# Patient Record
Sex: Male | Born: 1945 | Hispanic: No | Smoking: Never smoker
Health system: Southern US, Community
[De-identification: ages and names within clinical notes are randomized; demographics above are authoritative.]

## PROBLEM LIST (undated history)

## (undated) DIAGNOSIS — Z87442 Personal history of urinary calculi: Secondary | ICD-10-CM

## (undated) DIAGNOSIS — S129XXA Fracture of neck, unspecified, initial encounter: Secondary | ICD-10-CM

## (undated) DIAGNOSIS — Z77098 Contact with and (suspected) exposure to other hazardous, chiefly nonmedicinal, chemicals: Secondary | ICD-10-CM

## (undated) DIAGNOSIS — T451X5A Adverse effect of antineoplastic and immunosuppressive drugs, initial encounter: Secondary | ICD-10-CM

## (undated) DIAGNOSIS — I427 Cardiomyopathy due to drug and external agent: Secondary | ICD-10-CM

## (undated) DIAGNOSIS — R7303 Prediabetes: Secondary | ICD-10-CM

## (undated) DIAGNOSIS — J189 Pneumonia, unspecified organism: Secondary | ICD-10-CM

## (undated) DIAGNOSIS — I509 Heart failure, unspecified: Secondary | ICD-10-CM

## (undated) DIAGNOSIS — Z95810 Presence of automatic (implantable) cardiac defibrillator: Secondary | ICD-10-CM

## (undated) DIAGNOSIS — Z9289 Personal history of other medical treatment: Secondary | ICD-10-CM

## (undated) DIAGNOSIS — C8590 Non-Hodgkin lymphoma, unspecified, unspecified site: Secondary | ICD-10-CM

## (undated) HISTORY — PX: TONSILLECTOMY: SUR1361

## (undated) HISTORY — DX: Heart failure, unspecified: I50.9

## (undated) HISTORY — DX: Non-Hodgkin lymphoma, unspecified, unspecified site: C85.90

---

## 2016-09-17 DIAGNOSIS — Z9289 Personal history of other medical treatment: Secondary | ICD-10-CM

## 2016-09-17 DIAGNOSIS — J189 Pneumonia, unspecified organism: Secondary | ICD-10-CM

## 2016-09-17 HISTORY — DX: Personal history of other medical treatment: Z92.89

## 2016-09-17 HISTORY — DX: Pneumonia, unspecified organism: J18.9

## 2016-09-17 HISTORY — PX: PICC LINE INSERTION: CATH118290

## 2016-09-17 HISTORY — PX: CYSTOSCOPY W/ STONE MANIPULATION: SHX1427

## 2016-11-15 DIAGNOSIS — C8590 Non-Hodgkin lymphoma, unspecified, unspecified site: Secondary | ICD-10-CM

## 2016-11-15 HISTORY — DX: Non-Hodgkin lymphoma, unspecified, unspecified site: C85.90

## 2017-07-09 ENCOUNTER — Ambulatory Visit (INDEPENDENT_AMBULATORY_CARE_PROVIDER_SITE_OTHER): Payer: Non-veteran care | Admitting: Internal Medicine

## 2017-07-09 ENCOUNTER — Ambulatory Visit (INDEPENDENT_AMBULATORY_CARE_PROVIDER_SITE_OTHER)
Admission: RE | Admit: 2017-07-09 | Discharge: 2017-07-09 | Disposition: A | Discharge status: Home / Self Care | Payer: No Typology Code available for payment source | Source: Ambulatory Visit | Attending: Internal Medicine | Admitting: Internal Medicine

## 2017-07-09 ENCOUNTER — Encounter: Payer: Self-pay | Admitting: Internal Medicine

## 2017-07-09 VITALS — BP 108/68 | HR 105 | Ht 74.0 in | Wt 192.2 lb

## 2017-07-09 DIAGNOSIS — Z8709 Personal history of other diseases of the respiratory system: Secondary | ICD-10-CM

## 2017-07-09 DIAGNOSIS — Z8781 Personal history of (healed) traumatic fracture: Secondary | ICD-10-CM

## 2017-07-09 NOTE — Progress Notes (Signed)
Subjective:     Patient ID: Cesar Campbell, male   DOB: 01-Jan-1946, 71 y.o.   MRN: [id number] PCP Quintin Alto, MD  HPI   IOV 07/09/2017  Chief Complaint  Patient presents with  . Advice Only    [facility]. Denies any cough, SOB, or CP. 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 Pt fell off the bed while asleep and cracked two ribs. Pt had a CT scan and cxr done and that was when they found the fluid.   71 year old Native American male. Ref[facility] Dr. Posey Pronto his oncologist. He is accompanied by his sister. 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 He was getting chemotherapy in [place] where he used to live. Then in April/May 2018 his wife died and he also had an ICU admission for sepsis with pancytopenia. After this his sister moved into the [facility], [place]. Most recently his finished his final CHOP chemotherapy regimen His main problem is fatigue. According to the sister and him in August 71 year old out of bed and accidentally fell and fractured his right ribs. A CT scan done sometime in September 2018 at the [facility] shows rib fracture with pleural effusion and therefore he is been referred. Because of the long wait time at the [facility] he has not seen a pulmonologist and it is taken this long to get a CT scan Therefore he is off to go through the choice program and seek pulmonary consultation with [place]. Currently he says he is using incentive spirometry. He denies any shortness of breath or cough or wheezing or edema or paroxysmal nocturnal dyspnea or orthopnea. His weight loss. This no underlying pulmonary issues of COPD or pulmonary fibrosis    has no past medical history on file.   reports that he has never smoked. He has never used smokeless tobacco.  No past surgical history on file.  Not on File   There is no immunization history on file for  this patient.  No family history on file.   Current Outpatient Prescriptions:  .  finasteride (PROSCAR) 5 MG tablet, Take 5 mg by mouth daily., Disp: , Rfl:  .  tamsulosin (FLOMAX) 0.4 MG CAPS capsule, Take 0.4 mg by mouth daily after supper., Disp: , Rfl:     Review of Systems     Objective:   Physical Exam  Constitutional: He is oriented to person, place, and time. He appears well-developed and well-nourished. No distress.  Mild alopecia  HENT:  Head: Normocephalic and atraumatic.  Right Ear: External ear normal.  Left Ear: External ear normal.  Mouth/Throat: Oropharynx is clear and moist. No oropharyngeal exudate.  Eyes: Pupils are equal, round, and reactive to light. Conjunctivae and EOM are normal. Right eye exhibits no discharge. Left eye exhibits no discharge. No scleral icterus.  Neck: Normal range of motion. Neck supple. No JVD present. No tracheal deviation present. No thyromegaly present.  Cardiovascular: Normal rate, regular rhythm and intact distal pulses.  Exam reveals no gallop and no friction rub.   No murmur heard. Pulmonary/Chest: Effort normal and breath sounds normal. No respiratory distress. He has no wheezes. He has no rales. He exhibits no tenderness.  Abdominal: Soft. Bowel sounds are normal. He exhibits no distension and no mass. There is no tenderness. There is no rebound and no guarding.  Musculoskeletal: Normal range of motion.  He exhibits no edema or tenderness.  Looks a bit deconditioned  Lymphadenopathy:    He has no cervical adenopathy.  Neurological: He is alert and oriented to person, place, and time. He has normal reflexes. No cranial nerve deficit. Coordination normal.  Skin: Skin is warm and dry. No rash noted. He is not diaphoretic. No erythema. No pallor.  Psychiatric: He has a normal mood and affect. His behavior is normal. Judgment and thought content normal.  Nursing note and vitals reviewed.  Vitals:   07/09/17 1457  BP: 108/68   Pulse: (!) 105  SpO2: 97%  Weight: 192 lb 3.2 oz (87.2 kg)  Height: 6\' 2"  (1.88 m)    Estimated body mass index is 24.68 kg/m as calculated from the following:   Height as of this encounter: 6\' 2"  (1.88 m).   Weight as of this encounter: 192 lb 3.2 oz (87.2 kg).       Assessment:       ICD-10-CM   1. History of rib fracture Z87.81 DG Chest 2 View  2. History of pleural effusion Z87.09 DG Chest 2 View       Plan:       Please do cxr 2 view to decide the next step in your care Will call with results   Dr. Brand Males, M.D., [facility].C.P Pulmonary and Critical Care Medicine Staff Physician [place] [facility] Pager: [phone], If no answer or between  15:00h - 7:00h: call [phone]  07/09/2017 3:35 PM

## 2017-07-09 NOTE — Progress Notes (Signed)
   Subjective:    Patient ID: Cesar Campbell, male    DOB: November 15, 1945, 71 y.o.   MRN: [id number]  HPI    Review of Systems  Constitutional: Negative for fever and unexpected weight change.  HENT: Negative for congestion, dental problem, ear pain, nosebleeds, postnasal drip, rhinorrhea, sinus pressure, sneezing, sore throat and trouble swallowing.   Eyes: Negative for redness and itching.  Respiratory: Negative for cough, chest tightness, shortness of breath and wheezing.   Cardiovascular: Positive for leg swelling. Negative for palpitations.  Gastrointestinal: Negative for nausea and vomiting.  Genitourinary: Negative for dysuria.  Musculoskeletal: Negative for joint swelling.  Skin: Negative for rash.  Allergic/Immunologic: Positive for environmental allergies. Negative for food allergies and immunocompromised state.  Neurological: Negative for headaches.  Hematological: Does not bruise/bleed easily.  Psychiatric/Behavioral: Negative for dysphoric mood. The patient is not nervous/anxious.        Objective:   Physical Exam        Assessment & Plan:

## 2017-07-09 NOTE — Patient Instructions (Signed)
ICD-10-CM   1. History of rib fracture Z87.81   2. History of pleural effusion Z87.09     Please do cxr 2 view to decide the next step in your care Will call with results

## 2017-07-15 ENCOUNTER — Telehealth: Payer: Self-pay | Admitting: Internal Medicine

## 2017-07-15 DIAGNOSIS — R0602 Shortness of breath: Secondary | ICD-10-CM

## 2017-07-15 NOTE — Telephone Encounter (Signed)
Let [name] know that CXR shows   A) no report of rib fracture B) bilateral pleural effusions - more on the left (other side of rib fracture hx) overall small to moderate C) some enarlgement in central chst and this could be his lymphoma  In order to assess anything further he needs  A) CT chest with contrast - mediastinal adenopathy, hx rib fracture, bilateral effusion, lymphoma, dyspnea B) ECHO   ANd return to see [place]  Dr. Brand Males, M.D., [facility].C.P Pulmonary and Critical Care Medicine Staff Physician [place] [facility] Pager: [phone], If no answer or between  15:00h - 7:00h: call [phone]  07/15/2017 9:41 AM

## 2017-07-19 NOTE — Telephone Encounter (Signed)
Called pt to relay the results of his CXR with him and spoke with his sister, Cesar Campbell. Cesar Campbell the results from the CXR and that MR wanted pt to have a CT with contrast done as well as an ECHO.  Pt had a CT w/ contrast done at the [facility] 07/16/17. Cesar Campbell stated to me that she will have the [place] fax [place] the report of the CT scan.  Will place order for the Echo to be done.  Nothing further needed at this time.

## 2017-07-22 ENCOUNTER — Telehealth: Payer: Self-pay | Admitting: Internal Medicine

## 2017-07-22 ENCOUNTER — Encounter: Payer: Self-pay | Admitting: Internal Medicine

## 2017-07-22 ENCOUNTER — Ambulatory Visit (INDEPENDENT_AMBULATORY_CARE_PROVIDER_SITE_OTHER): Payer: Non-veteran care | Admitting: Internal Medicine

## 2017-07-22 ENCOUNTER — Ambulatory Visit (INDEPENDENT_AMBULATORY_CARE_PROVIDER_SITE_OTHER)
Admission: RE | Admit: 2017-07-22 | Discharge: 2017-07-22 | Disposition: A | Discharge status: Home / Self Care | Payer: Non-veteran care | Source: Ambulatory Visit | Attending: Internal Medicine | Admitting: Internal Medicine

## 2017-07-22 VITALS — BP 120/82 | HR 100 | Ht 74.0 in | Wt 196.8 lb

## 2017-07-22 DIAGNOSIS — Z8709 Personal history of other diseases of the respiratory system: Secondary | ICD-10-CM

## 2017-07-22 DIAGNOSIS — R0689 Other abnormalities of breathing: Secondary | ICD-10-CM

## 2017-07-22 DIAGNOSIS — R0600 Dyspnea, unspecified: Secondary | ICD-10-CM | POA: Diagnosis not present

## 2017-07-22 NOTE — Telephone Encounter (Signed)
Spoke with Marlowe Kays. She stated that the [place] will not release the CT results without a signed medical release. She stated that she will bring the patient by the office today to sign one so that we can fax it to [place].   Nothing else needed at time of call.

## 2017-07-22 NOTE — Telephone Encounter (Signed)
Spoke with Marlowe Kays. She stated she went by to check on the patient this morning. He stated that he has been SOB for the past night and is panting to catch his breath.   Patient has been scheduled to see MR this afternoon at 130. She verbalized understanding. Nothing else needed at time of call.

## 2017-07-22 NOTE — Progress Notes (Signed)
Subjective:     Patient ID: Cesar Campbell, male   DOB: 1946/07/10, 71 y.o.   MRN: [id number]  HPI IOV 07/09/2017  Chief Complaint  Patient presents with  . Advice Only    [facility] due to [facility]usion. Denies any cough, SOB, or CP. 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 Pt fell off the bed while asleep and cracked two ribs. Pt had a CT scan and cxr done and that was when they found the fluid.   71 year old Native American male. Referred by the [facility] Dr. Posey Pronto his oncologist. He is accompanied by his sister. 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 He was getting chemotherapy in [place] where he used to live. Then in April/May 2018 his wife died and he also had an ICU admission for sepsis with pancytopenia. After this his sister moved into the [facility], [place]. Most recently his finished his final CHOP chemotherapy regimen His main problem is fatigue. According to the sister and him in August 71 year old out of bed and accidentally fell and fractured his right ribs. A CT scan done sometime in September 2018 at the [facility] shows rib fracture with pleural effusion and therefore he is been referred. Because of the long wait time at the [facility] he has not seen a pulmonologist and it is taken this long to get a CT scan Therefore he is off to go through the choice program and seek pulmonary consultation with [place]. Currently he says he is using incentive spirometry. He denies any shortness of breath or cough or wheezing or edema or paroxysmal nocturnal dyspnea or orthopnea. His weight loss. This no underlying pulmonary issues of COPD or pulmonary fibrosis    OV 07/22/2017  Chief Complaint  Patient presents with  . Acute Visit    Pt  became very SOB overnight 07/21/17. States that he was hyperventilating and is very SOB, coughing up clear phlegm, and chest discomfort.   71 year-old male  returns acutely. I saw him just a few weeks ago with shortness of breath in the setting of lymphoma based chemotherapy. Chest x-ray showed bilateral effusions. We were in the process of getting a CT scan chest and echocardiogram. He tells me that he did have a CT scan of the chest but at the [facility] in [place]. He does not know the results. I do not have those results with me. He says since then he's had progressive shortness of breath and last night was significantly worse when he is home and try to do some activities of daily living such as changing the sheets. Therefore he is here acutely today. He is yet to have his echo. This no other symptoms such as cough or wheezing or orthopnea     has no past medical history on file.   reports that  has never smoked. he has never used smokeless tobacco.  No past surgical history on file.  Allergies  Allergen Reactions  . Penicillins Hives    Immunization History  Administered Date(s) Administered  . Influenza, High Dose Seasonal PF 07/15/2017    No family history on file.   Current Outpatient Medications:  .  albuterol (PROVENTIL HFA;VENTOLIN HFA) 108 (90 Base) MCG/ACT inhaler, Inhale 2 puffs every 6 (six) hours as needed into the lungs for wheezing or shortness of breath., Disp: , Rfl:  .  finasteride (PROSCAR) 5 MG  tablet, Take 5 mg by mouth daily., Disp: , Rfl:  .  tamsulosin (FLOMAX) 0.4 MG CAPS capsule, Take 0.4 mg by mouth daily after supper., Disp: , Rfl:     Review of Systems     Objective:   Physical Exam  Constitutional: He is oriented to person, place, and time. He appears well-developed and well-nourished. No distress.  Obese, sitting in wheel chair, talkative today, nasal cannula on  HENT:  Head: Normocephalic and atraumatic.  Right Ear: External ear normal.  Left Ear: External ear normal.  Mouth/Throat: Oropharynx is clear and moist. No oropharyngeal exudate.  Eyes: Conjunctivae and EOM are normal.  Pupils are equal, round, and reactive to light. Right eye exhibits no discharge. Left eye exhibits no discharge. No scleral icterus.  Neck: Normal range of motion. Neck supple. No JVD present. No tracheal deviation present. No thyromegaly present.  Cardiovascular: Normal rate, regular rhythm and intact distal pulses. Exam reveals no gallop and no friction rub.  No murmur heard. Pulmonary/Chest: Effort normal and breath sounds normal. No respiratory distress. He has no wheezes. He has no rales. He exhibits no tenderness.  Abdominal: Soft. Bowel sounds are normal. He exhibits no distension and no mass. There is no tenderness. There is no rebound and no guarding.  Musculoskeletal: Normal range of motion. He exhibits no edema or tenderness.  Lymphadenopathy:    He has no cervical adenopathy.  Neurological: He is alert and oriented to person, place, and time. He has normal reflexes. No cranial nerve deficit. Coordination normal.  Skin: Skin is warm and dry. No rash noted. He is not diaphoretic. No erythema. No pallor.  Psychiatric: He has a normal mood and affect. His behavior is normal. Judgment and thought content normal.  Nursing note and vitals reviewed.  Vitals:   07/22/17 1339  BP: 120/82  Pulse: 100  SpO2: 99%  Weight: 196 lb 12.8 oz (89.3 kg)  Height: 6\' 2"  (1.88 m)    Estimated body mass index is 25.27 kg/m as calculated from the following:   Height as of this encounter: 6\' 2"  (1.88 m).   Weight as of this encounter: 196 lb 12.8 oz (89.3 kg).      Assessment:       ICD-10-CM   1. History of pleural effusion Z87.09 DG Chest 2 View  2. Dyspnea and respiratory abnormalities R06.00 DG Chest 2 View   R06.89        Plan:      repeat cxr 2 view; depending on results timing of thoracentesis Sign release to get CT report from [place] done in the last few weeks Get echo ASAP   Followup Will call with cxr results 07/22/2017 or 07/23/17 Any worsening problems go to  ER Return in few weeks but after echo   Dr. Brand Males, M.D., [facility].C.P Pulmonary and Critical Care Medicine Staff Physician [place] [facility] Pager: [phone], If no answer or between  15:00h - 7:00h: call [phone]  07/22/2017 2:01 PM

## 2017-07-22 NOTE — Patient Instructions (Signed)
ICD-10-CM   1. History of pleural effusion Z87.09   2. Dyspnea and respiratory abnormalities R06.00    R06.89     repeat cxr 2 view; depending on results timing of thoracentesis Sign release to get CT report from [place] done in the last few weeks Get echo ASAP   Followup Will call with cxr results 07/22/2017 or 07/23/17 Any worsening problems go to ER Return in few weeks but after echo

## 2017-07-23 ENCOUNTER — Ambulatory Visit (HOSPITAL_COMMUNITY): Payer: Non-veteran care | Attending: Cardiovascular Disease

## 2017-07-23 ENCOUNTER — Other Ambulatory Visit: Payer: Self-pay

## 2017-07-23 DIAGNOSIS — J90 Pleural effusion, not elsewhere classified: Secondary | ICD-10-CM | POA: Diagnosis not present

## 2017-07-23 DIAGNOSIS — Z8572 Personal history of non-Hodgkin lymphomas: Secondary | ICD-10-CM | POA: Insufficient documentation

## 2017-07-23 DIAGNOSIS — I081 Rheumatic disorders of both mitral and tricuspid valves: Secondary | ICD-10-CM | POA: Diagnosis not present

## 2017-07-23 DIAGNOSIS — R609 Edema, unspecified: Secondary | ICD-10-CM | POA: Diagnosis not present

## 2017-07-23 DIAGNOSIS — Z9221 Personal history of antineoplastic chemotherapy: Secondary | ICD-10-CM | POA: Insufficient documentation

## 2017-07-23 DIAGNOSIS — R0602 Shortness of breath: Secondary | ICD-10-CM | POA: Diagnosis not present

## 2017-07-24 ENCOUNTER — Telehealth: Payer: Self-pay | Admitting: Internal Medicine

## 2017-07-24 DIAGNOSIS — I5020 Unspecified systolic (congestive) heart failure: Secondary | ICD-10-CM

## 2017-07-24 MED ORDER — FUROSEMIDE 40 MG PO TABS: 40.0000 mg | ORAL_TABLET | Freq: Every day | ORAL | 5 refills | Status: DC

## 2017-07-24 NOTE — Telephone Encounter (Signed)
Called to refer this patient to Dr Einar Gip and due to the insurance the referral has to come from the [place] or it will not be covered I have called the patient and spoke to the wife they are seeing the [place] tomorrow. She stated they will speak to them tomorrow about this.

## 2017-07-24 NOTE — Telephone Encounter (Signed)
Sending to triage given nature of echo result. [facility]sts done at [facility]  CXR - unchanged effusion   ECHO - EF 25% and he has systolic chf   Plan - start lasix 40mg  daily + kcl 11meq daily  - any issues with breathing go to ER - refer cardiology [facility] or Dr Einar Gip or [facility] - anyone who can see him this week  - w ill reviwe outside records when I return   Dr. Brand Males, M.D., [facility].C.P Pulmonary and Critical Care Medicine Staff Physician [place] [facility] Pager: [phone], If no answer or between  15:00h - 7:00h: call [phone]  07/24/2017 12:15 PM     Dg Chest 2 View  Result Date: 07/23/2017 CLINICAL DATA:  Left pleural effusion EXAM: CHEST  2 VIEW COMPARISON:  07/09/2017 FINDINGS: The heart remains enlarged. Lungs remain under aerated. Bilateral pleural effusions are stable. Bibasilar opacity compatible with atelectasis versus airspace disease is stable. No pneumothorax. IMPRESSION: Stable. Bilateral pleural effusions and bibasilar pulmonary opacity as described. Electronically Signed   By: Marybelle Killings M.D.   On: 07/23/2017 08:00

## 2017-07-24 NOTE — Telephone Encounter (Signed)
Spoke with pt's wife and advised results and plan to see Dr. Einar Gip and to start Lasix. Nothing further is needed.

## 2017-08-06 ENCOUNTER — Ambulatory Visit: Payer: No Typology Code available for payment source | Admitting: Internal Medicine

## 2017-08-19 ENCOUNTER — Ambulatory Visit: Payer: No Typology Code available for payment source | Admitting: Internal Medicine

## 2017-10-14 ENCOUNTER — Telehealth: Payer: Self-pay | Admitting: Internal Medicine

## 2017-10-14 NOTE — Telephone Encounter (Signed)
Faxed pt's OV from 07/22/17 to Tim with [facility]  Called Tim letting him know this was done.   Tim expressed understanding. Nothing further needed at this current time.

## 2017-12-25 ENCOUNTER — Encounter: Payer: Self-pay | Admitting: Cardiovascular Disease

## 2017-12-25 ENCOUNTER — Ambulatory Visit (INDEPENDENT_AMBULATORY_CARE_PROVIDER_SITE_OTHER): Payer: No Typology Code available for payment source | Admitting: Cardiovascular Disease

## 2017-12-25 DIAGNOSIS — I428 Other cardiomyopathies: Secondary | ICD-10-CM | POA: Diagnosis not present

## 2017-12-25 NOTE — Patient Instructions (Signed)
Medication Instructions:  Your physician recommends that you continue on your current medications as directed. Please refer to the Current Medication list given to you today.   Labwork: none  Testing/Procedures: none  Follow-Up: Your physician recommends that you schedule a follow-up appointment in: in the next WEEK with Dr. Sallyanne Kuster - ICD implacment  You have been referred to CHF Clinic    Any Other Special Instructions Will Be Listed Below (If Applicable).     If you need a refill on your cardiac medications before your next appointment, please call your pharmacy.

## 2017-12-25 NOTE — Progress Notes (Signed)
12/25/2017 [name]   Aug 29, 1946  [id number]  Primary Physician Posey Pronto, Ninfa Meeker, MD Primary Cardiologist: Lorretta Harp MD Lupe Carney, [place]  HPI:  Cesar Campbell is a 72 y.o. thin-appearing widowed Caucasian male father of one child, grandfather and 2 grandchildren who is a retired Probation officer and was referred by the [employer], resulting from ICD implantation. He has no cardiac risk factors. He does have non-Hodgkin's lymphoma currently in remission after 6 rounds of CHOP chemotherapy the left one of which was 9/18. His EF is 20% range. He is on optimal medications for this and has early class II heart failure symptoms. He's been wearing a LifeVest for last 5 months.    Current Meds  Medication Sig  . albuterol (PROVENTIL HFA;VENTOLIN HFA) 108 (90 Base) MCG/ACT inhaler Inhale 2 puffs every 6 (six) hours as needed into the lungs for wheezing or shortness of breath.  . finasteride (PROSCAR) 5 MG tablet Take 5 mg by mouth daily.  . furosemide (LASIX) 40 MG tablet Take 1 tablet (40 mg total) daily by mouth.  . tamsulosin (FLOMAX) 0.4 MG CAPS capsule Take 0.4 mg by mouth daily after supper.     Allergies  Allergen Reactions  . Penicillins Hives    Social History   Socioeconomic History  . Marital status: Widowed    Spouse name: Not on file  . Number of children: Not on file  . Years of education: Not on file  . Highest education level: Not on file  Occupational History  . Not on file  Social Needs  . Financial resource strain: Not on file  . Food insecurity:    Worry: Not on file    Inability: Not on file  . Transportation needs:    Medical: Not on file    Non-medical: Not on file  Tobacco Use  . Smoking status: Never Smoker  . Smokeless tobacco: Never Used  Substance and Sexual Activity  . Alcohol use: Not on file  . Drug use: Not on file  . Sexual activity: Not on file  Lifestyle  . Physical activity:    Days per week: Not on file    Minutes per  session: Not on file  . Stress: Not on file  Relationships  . Social connections:    Talks on phone: Not on file    Gets together: Not on file    Attends religious service: Not on file    Active member of club or organization: Not on file    Attends meetings of clubs or organizations: Not on file    Relationship status: Not on file  . Intimate partner violence:    Fear of current or ex partner: Not on file    Emotionally abused: Not on file    Physically abused: Not on file    Forced sexual activity: Not on file  Other Topics Concern  . Not on file  Social History Narrative  . Not on file     Review of Systems: General: negative for chills, fever, night sweats or weight changes.  Cardiovascular: negative for chest pain, dyspnea on exertion, edema, orthopnea, palpitations, paroxysmal nocturnal dyspnea or shortness of breath Dermatological: negative for rash Respiratory: negative for cough or wheezing Urologic: negative for hematuria Abdominal: negative for nausea, vomiting, diarrhea, bright red blood per rectum, melena, or hematemesis Neurologic: negative for visual changes, syncope, or dizziness All other systems reviewed and are otherwise negative except as noted above.  Blood pressure 112/74, pulse 70, height 6\' 2"  (1.88 m), weight 192 lb (87.1 kg).  General appearance: alert and no distress Neck: no adenopathy, no carotid bruit, no JVD, supple, symmetrical, trachea midline and thyroid not enlarged, symmetric, no tenderness/mass/nodules Lungs: clear to auscultation bilaterally Heart: regular rate and rhythm, S1, S2 normal, no murmur, click, rub or gallop Extremities: extremities normal, atraumatic, no cyanosis or edema Pulses: 2+ and symmetric Skin: Skin color, texture, turgor normal. No rashes or lesions Neurologic: Alert and oriented X 3, normal strength and tone. Normal symmetric reflexes. Normal coordination and gait  EKG sinus rhythm at 70 without ST or T-wave  changes. I personally reviewed this EKG.  ASSESSMENT AND PLAN:   Nonischemic cardiomyopathy [facility]) Cesar Campbell unfortunately had non-Hodgkin's lymphoma and 6 rounds of CHOP chemotherapy. His EF is about 20% range. He has been wearing a life vest for the last 5 months. He is in the [place] [facility] in [place]. He does have mild mild heart failure symptoms on appropriate optimal medical therapy. He was referred here for ICD implantation. I am going to refer him to Dr. Sallyanne Kuster to discuss and perform this procedure.      Lorretta Harp MD FACP,FACC,FAHA, [facility] 12/25/2017 3:27 PM

## 2017-12-25 NOTE — Assessment & Plan Note (Signed)
Cesar Campbell unfortunately had non-Hodgkin's lymphoma and 6 rounds of CHOP chemotherapy. His EF is about 20% range. He has been wearing a life vest for the last 5 months. He is in the [place] medical system in [place]. He does have mild mild heart failure symptoms on appropriate optimal medical therapy. He was referred here for ICD implantation. I am going to refer him to Dr. Sallyanne Kuster to discuss and perform this procedure.

## 2017-12-26 ENCOUNTER — Telehealth: Payer: Self-pay | Admitting: Cardiovascular Disease

## 2017-12-26 ENCOUNTER — Telehealth (HOSPITAL_COMMUNITY): Payer: Self-pay | Admitting: Internal Medicine

## 2017-12-26 NOTE — Telephone Encounter (Signed)
Discussed with Chelley and ok to work patient in on 4/17 at 8:40. Advised, verbalized understanding

## 2017-12-26 NOTE — Telephone Encounter (Signed)
Called patient to give him New CHF appt with Dr. Haroldine Laws.  No answer or machine.  Will try to call patient back.

## 2017-12-26 NOTE — Telephone Encounter (Signed)
New message     Patient was scheduled for a MRI on  01/02/18 through the [place] , they want to make sure the cancer is still in remission, please call to follow up and schedule surgery for pace maker

## 2018-01-01 ENCOUNTER — Ambulatory Visit (INDEPENDENT_AMBULATORY_CARE_PROVIDER_SITE_OTHER): Payer: No Typology Code available for payment source | Admitting: Cardiovascular Disease

## 2018-01-01 ENCOUNTER — Encounter: Payer: Self-pay | Admitting: Cardiovascular Disease

## 2018-01-01 VITALS — BP 102/73 | HR 88 | Ht 75.0 in | Wt 197.0 lb

## 2018-01-01 DIAGNOSIS — Z9189 Other specified personal risk factors, not elsewhere classified: Secondary | ICD-10-CM

## 2018-01-01 DIAGNOSIS — I5022 Chronic systolic (congestive) heart failure: Secondary | ICD-10-CM

## 2018-01-01 DIAGNOSIS — I428 Other cardiomyopathies: Secondary | ICD-10-CM | POA: Diagnosis not present

## 2018-01-01 DIAGNOSIS — Z01812 Encounter for preprocedural laboratory examination: Secondary | ICD-10-CM

## 2018-01-01 NOTE — Progress Notes (Signed)
Cardiology Office Note:    Date:  01/01/2018   ID:  [name], DOB 01/20/1946, MRN [id number]  PCP:  Quintin Alto, MD  Cardiologist:  Quay Burow, MD   Referring MD: Quintin Alto, MD   Chief Complaint  Patient presents with  . AICD Problem    Referred to discuss AICD    History of Present Illness:    Cesar Campbell is a 72 y.o. 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  He is routinely followed at the [facility] in [place].  His ejection fraction has been estimated at 20% and has not improved despite treatment with maximum tolerated doses of ACE inhibitor and beta-blocker.  His typical systolic blood pressure is around 100, he occasionally has dizziness, there does not appear to be room for additional heart failure therapy.  Despite this he has good functional status, NYHA class II and does not require loop diuretics.  His echocardiogram also confirmed evidence of diastolic dysfunction with elevated filling pressures, but he denies shortness of breath at rest or with usual activity.  There are no significant valvular abnormalities on his echo, the pulmonary pressure was mildly elevated.  He had a PICC line in his left antecubital vein for chemotherapy, which has been since removed.  He has never had angina pectoris.  His EKG does not show any ischemic changes and in fact is remarkably normal, with narrow QRS and normal QT interval.  He has been wearing a LifeVest for the last 5 months, no interventions to date.  Past Medical History:  Diagnosis Date  . CHF (congestive heart failure) ([place])   . NHL (non-Hodgkin's lymphoma) [facility])     Past Surgical History:  Procedure Laterality Date  . PICC LINE INSERTION Left     Current Medications: Current Meds  Medication Sig  . albuterol (PROVENTIL HFA;VENTOLIN HFA) 108 (90 Base) MCG/ACT inhaler Inhale 2 puffs every 6 (six) hours as needed into the lungs  for wheezing or shortness of breath.  . carvedilol (COREG) 12.5 MG tablet Take 6.25 mg by mouth 2 (two) times daily with a meal.  . finasteride (PROSCAR) 5 MG tablet Take 5 mg by mouth daily.  . furosemide (LASIX) 40 MG tablet Take 1 tablet (40 mg total) daily by mouth.  Marland Kitchen lisinopril (PRINIVIL,ZESTRIL) 5 MG tablet Take 5 mg by mouth daily.  . tamsulosin (FLOMAX) 0.4 MG CAPS capsule Take 0.4 mg by mouth daily after supper.     Allergies:   Penicillins   Social History   Socioeconomic History  . Marital status: Widowed    Spouse name: Not on file  . Number of children: Not on file  . Years of education: Not on file  . Highest education level: Not on file  Occupational History  . Not on file  Social Needs  . Financial resource strain: Not on file  . Food insecurity:    Worry: Not on file    Inability: Not on file  . Transportation needs:    Medical: Not on file    Non-medical: Not on file  Tobacco Use  . Smoking status: Never Smoker  . Smokeless tobacco: Never Used  Substance and Sexual Activity  . Alcohol use: Not on file  . Drug use: Not on file  . Sexual activity: Not on file  Lifestyle  . Physical activity:    Days per week: Not on file    Minutes per session: Not on  file  . Stress: Not on file  Relationships  . Social connections:    Talks on phone: Not on file    Gets together: Not on file    Attends religious service: Not on file    Active member of club or organization: Not on file    Attends meetings of clubs or organizations: Not on file    Relationship status: Not on file  Other Topics Concern  . Not on file  Social History Narrative  . Not on file     Family History: The patient's family history includes Heart attack in his father.  ROS:   Please see the history of present illness.     All other systems reviewed and are negative.  EKGs/Labs/Other Studies Reviewed:    The following studies were reviewed today: Notes from Dr. Gwenlyn Found  EKG:  EKG  is not ordered today.  The ekg ordered 12/25/2017 demonstrates NSR, normal ECG, QRS <100 ms, normal QTc  Recent Labs: No results found for requested labs within last 8760 hours.  Recent Lipid Panel No results found for: CHOL, TRIG, HDL, CHOLHDL, VLDL, LDLCALC, LDLDIRECT  Physical Exam:    VS:  BP 102/73   Pulse 88   Ht 6\' 3"  (1.905 m)   Wt 197 lb (89.4 kg)   BMI 24.62 kg/m     Wt Readings from Last 3 Encounters:  01/01/18 197 lb (89.4 kg)  12/25/17 192 lb (87.1 kg)  07/22/17 196 lb 12.8 oz (89.3 kg)     GEN: Lean, Well nourished, well developed in no acute distress HEENT: Normal NECK: No JVD; No carotid bruits LYMPHATICS: No lymphadenopathy CARDIAC: RRR, no murmurs, rubs, gallops. No collateral venous circulation L shoulder RESPIRATORY:  Clear to auscultation without rales, wheezing or rhonchi  ABDOMEN: Soft, non-tender, non-distended MUSCULOSKELETAL:  No edema; No deformity  SKIN: Warm and dry NEUROLOGIC:  Alert and oriented x 3 PSYCHIATRIC:  Normal affect   ASSESSMENT:    1. CHF (congestive heart failure), NYHA class II, chronic, systolic ([place])   2. Nonischemic cardiomyopathy ([place])   3. At risk for sudden cardiac death   4. Pre-procedure lab exam    PLAN:    In order of problems listed above:  1. CHF:  Clinically euvolemic, NYHA class 2. On maximum tolerated doses of carvedilol and ACEi. 2. CMP: chemo related. No symptoms or signs of CAD. 3. AICD: meets criteria for primary prevention ICD implantation for non ischemic cardiomyopathy (left ventricular ejection fraction under 35%, heart failure NYHA class II-III, on comprehensive medical therapy). Discussed pros and cons of AICD in detail. This procedure has been fully reviewed with the patient and written informed consent has been obtained.    Medication Adjustments/Labs and Tests Ordered: Current medicines are reviewed at length with the patient today.  Concerns regarding medicines are outlined above.  Orders  Placed This Encounter  Procedures  . Basic metabolic panel  . CBC  . Protime-INR   No orders of the defined types were placed in this encounter.   Patient Instructions  You are scheduled for Implantable Cardioverter Defibrillator Implant on Wednesday, May 22nd, 2019 with Dr. Sallyanne Kuster.  Please arrive to the [facility] located at [place] "A" at 11:30 am. [employer] parking service is available.  You may eat an early, light breakfast but nothing to eat after 8:00 am.   Take your morning medications like you normally do with a sip of water.  You will need to have blood work completed prior to your procedure. Please have this done within 7 days prior to the procedure. There is a lab located in our office you can utilize if our location is convenient for you.  Plan for an overnight stay, bring your insurance cards, and a current list of your medications.  Wash your chest and neck with the surgical scrub provided or any brand of anti-bacterial soap the evening before and the morning of your procedure. Rinse well.  If you have any questions after you get home, please call the office at [phone].    Signed, Sanda Klein, MD  01/01/2018 11:19 AM    [facility]

## 2018-01-01 NOTE — Patient Instructions (Addendum)
You are scheduled for Implantable Cardioverter Defibrillator Implant on Wednesday, May 22nd, 2019 with Dr. Sallyanne Kuster.  Please arrive to the [facility] located at [place] "[address]" at 11:30 am. [employer] parking service is available.  You may eat an early, light breakfast but nothing to eat after 8:00 am.   Take your morning medications like you normally do with a sip of water.  You will need to have blood work completed prior to your procedure. Please have this done within 7 days prior to the procedure. There is a lab located in our office you can utilize if our location is convenient for you.  Plan for an overnight stay, bring your insurance cards, and a current list of your medications.  Wash your chest and neck with the surgical scrub provided or any brand of anti-bacterial soap the evening before and the morning of your procedure. Rinse well.  If you have any questions after you get home, please call the office at [phone].

## 2018-01-10 ENCOUNTER — Other Ambulatory Visit: Payer: Self-pay | Admitting: Cardiovascular Disease

## 2018-01-10 DIAGNOSIS — I428 Other cardiomyopathies: Secondary | ICD-10-CM

## 2018-01-31 LAB — BASIC METABOLIC PANEL
BUN / CREAT RATIO: 23 (ref 10–24)
BUN: 38 mg/dL — ABNORMAL HIGH (ref 8–27)
CHLORIDE: 102 mmol/L (ref 96–106)
CO2: 24 mmol/L (ref 20–29)
Calcium: 9.5 mg/dL (ref 8.6–10.2)
Creatinine, Ser: 1.64 mg/dL — ABNORMAL HIGH (ref 0.76–1.27)
GFR calc Af Amer: 47 mL/min/{1.73_m2} — ABNORMAL LOW (ref >59)
GFR calc non Af Amer: 41 mL/min/{1.73_m2} — ABNORMAL LOW (ref >59)
Glucose: 104 mg/dL — ABNORMAL HIGH (ref 65–99)
POTASSIUM: 5.1 mmol/L (ref 3.5–5.2)
SODIUM: 141 mmol/L (ref 134–144)

## 2018-01-31 LAB — CBC
Hematocrit: 35.5 % — ABNORMAL LOW (ref 37.5–51.0)
Hemoglobin: 11.9 g/dL — ABNORMAL LOW (ref 13.0–17.7)
MCH: 28.7 pg (ref 26.6–33.0)
MCHC: 33.5 g/dL (ref 31.5–35.7)
MCV: 86 fL (ref 79–97)
Platelets: 87 10*3/uL — CL (ref 150–379)
RBC: 4.15 x10E6/uL (ref 4.14–5.80)
RDW: 14.4 % (ref 12.3–15.4)
WBC: 3.5 10*3/uL (ref 3.4–10.8)

## 2018-01-31 LAB — PROTIME-INR
INR: 1.1 (ref 0.8–1.2)
PROTHROMBIN TIME: 10.9 s (ref 9.1–12.0)

## 2018-02-04 NOTE — Progress Notes (Signed)
ICD Criteria  Current LVEF:30-35%. Within 12 months prior to implant: Yes   Heart failure history: Yes, Class II  Cardiomyopathy history: Yes, Non-Ischemic Cardiomyopathy.  Atrial Fibrillation/Atrial Flutter: No.  Ventricular tachycardia history: No.  Cardiac arrest history: No.  History of syndromes with risk of sudden death: No.  Previous ICD: No.  Current ICD indication: Primary  PPM indication: No.   Class I or II Bradycardia indication present: No  Beta Blocker therapy for 3 or more months: Yes, prescribed.   Ace Inhibitor/ARB therapy for 3 or more months: Yes, prescribed.    

## 2018-02-05 ENCOUNTER — Ambulatory Visit (HOSPITAL_COMMUNITY)
Admission: RE | Admit: 2018-02-05 | Discharge: 2018-02-06 | Disposition: A | Discharge status: Home / Self Care | Payer: No Typology Code available for payment source | Source: Ambulatory Visit | Attending: Cardiovascular Disease | Admitting: Cardiovascular Disease

## 2018-02-05 ENCOUNTER — Encounter (HOSPITAL_COMMUNITY): Payer: Self-pay | Admitting: General Practice

## 2018-02-05 ENCOUNTER — Encounter (HOSPITAL_COMMUNITY)
Admission: RE | Disposition: A | Discharge status: Home / Self Care | Payer: Self-pay | Source: Ambulatory Visit | Attending: Cardiovascular Disease

## 2018-02-05 ENCOUNTER — Other Ambulatory Visit: Payer: Self-pay

## 2018-02-05 DIAGNOSIS — Z880 Allergy status to penicillin: Secondary | ICD-10-CM | POA: Diagnosis not present

## 2018-02-05 DIAGNOSIS — Z95810 Presence of automatic (implantable) cardiac defibrillator: Secondary | ICD-10-CM | POA: Diagnosis present

## 2018-02-05 DIAGNOSIS — I509 Heart failure, unspecified: Secondary | ICD-10-CM | POA: Diagnosis not present

## 2018-02-05 DIAGNOSIS — Z8572 Personal history of non-Hodgkin lymphomas: Secondary | ICD-10-CM | POA: Insufficient documentation

## 2018-02-05 DIAGNOSIS — Z9221 Personal history of antineoplastic chemotherapy: Secondary | ICD-10-CM | POA: Diagnosis not present

## 2018-02-05 DIAGNOSIS — I428 Other cardiomyopathies: Secondary | ICD-10-CM

## 2018-02-05 HISTORY — DX: Fracture of neck, unspecified, initial encounter: S12.9XXA

## 2018-02-05 HISTORY — DX: Personal history of other medical treatment: Z92.89

## 2018-02-05 HISTORY — PX: CARDIAC DEFIBRILLATOR PLACEMENT: SHX171

## 2018-02-05 HISTORY — DX: Pneumonia, unspecified organism: J18.9

## 2018-02-05 HISTORY — DX: Presence of automatic (implantable) cardiac defibrillator: Z95.810

## 2018-02-05 HISTORY — PX: ICD IMPLANT: EP1208

## 2018-02-05 HISTORY — DX: Adverse effect of antineoplastic and immunosuppressive drugs, initial encounter: T45.1X5A

## 2018-02-05 HISTORY — DX: Cardiomyopathy due to drug and external agent: I42.7

## 2018-02-05 HISTORY — DX: Contact with and (suspected) exposure to other hazardous, chiefly nonmedicinal, chemicals: Z77.098

## 2018-02-05 HISTORY — DX: Personal history of urinary calculi: Z87.442

## 2018-02-05 LAB — SURGICAL PCR SCREEN
MRSA, PCR: NEGATIVE
STAPHYLOCOCCUS AUREUS: NEGATIVE

## 2018-02-05 SURGERY — ICD IMPLANT

## 2018-02-05 MED ORDER — VANCOMYCIN HCL IN DEXTROSE 1-5 GM/200ML-% IV SOLN
1000.0000 mg | Freq: Once | INTRAVENOUS | Status: AC
  Administered 2018-02-05: 1000 mg via INTRAVENOUS

## 2018-02-05 MED ORDER — SODIUM CHLORIDE 0.9% FLUSH
3.0000 mL | Freq: Two times a day (BID) | INTRAVENOUS | Status: DC
  Administered 2018-02-05 (×2): 3 mL via INTRAVENOUS

## 2018-02-05 MED ORDER — SODIUM CHLORIDE 0.9 % IV SOLN
INTRAVENOUS | Status: DC
  Administered 2018-02-05: 14:00:00 via INTRAVENOUS

## 2018-02-05 MED ORDER — ONDANSETRON HCL 4 MG/2ML IJ SOLN: 4.0000 mg | Freq: Four times a day (QID) | INTRAMUSCULAR | Status: DC | PRN

## 2018-02-05 MED ORDER — LISINOPRIL 5 MG PO TABS
5.0000 mg | ORAL_TABLET | Freq: Every day | ORAL | Status: DC
  Administered 2018-02-05: 21:00:00 5 mg via ORAL
  Filled 2018-02-05 (×2): qty 1

## 2018-02-05 MED ORDER — SODIUM CHLORIDE 0.9% FLUSH: 3.0000 mL | INTRAVENOUS | Status: DC | PRN

## 2018-02-05 MED ORDER — GENTAMICIN SULFATE 40 MG/ML IJ SOLN
80.0000 mg | INTRAMUSCULAR | Status: AC
  Administered 2018-02-05: 80 mg

## 2018-02-05 MED ORDER — LIDOCAINE HCL (PF) 1 % IJ SOLN
INTRAMUSCULAR | Status: DC | PRN
  Administered 2018-02-05: 60 mL

## 2018-02-05 MED ORDER — YOU HAVE A PACEMAKER BOOK
Freq: Once | Status: AC
  Administered 2018-02-05: 21:00:00
  Filled 2018-02-05: qty 1

## 2018-02-05 MED ORDER — SODIUM CHLORIDE 0.9 % IV SOLN: 250.0000 mL | INTRAVENOUS | Status: DC | PRN

## 2018-02-05 MED ORDER — VANCOMYCIN HCL 10 G IV SOLR
1500.0000 mg | INTRAVENOUS | Status: DC
  Filled 2018-02-05: qty 1500

## 2018-02-05 MED ORDER — MIDAZOLAM HCL 5 MG/5ML IJ SOLN
INTRAMUSCULAR | Status: AC
  Filled 2018-02-05: qty 5

## 2018-02-05 MED ORDER — FUROSEMIDE 40 MG PO TABS
40.0000 mg | ORAL_TABLET | Freq: Every day | ORAL | Status: DC
  Administered 2018-02-06: 40 mg via ORAL
  Filled 2018-02-05 (×2): qty 1

## 2018-02-05 MED ORDER — TAMSULOSIN HCL 0.4 MG PO CAPS
0.4000 mg | ORAL_CAPSULE | Freq: Every day | ORAL | Status: DC
  Administered 2018-02-05: 0.4 mg via ORAL
  Filled 2018-02-05: qty 1

## 2018-02-05 MED ORDER — CHLORHEXIDINE GLUCONATE 4 % EX LIQD
60.0000 mL | Freq: Once | CUTANEOUS | Status: DC
  Filled 2018-02-05: qty 60

## 2018-02-05 MED ORDER — FINASTERIDE 5 MG PO TABS
5.0000 mg | ORAL_TABLET | Freq: Every day | ORAL | Status: DC
  Administered 2018-02-06: 09:00:00 5 mg via ORAL
  Filled 2018-02-05 (×2): qty 1

## 2018-02-05 MED ORDER — HYDROCODONE-ACETAMINOPHEN 5-325 MG PO TABS
1.0000 | ORAL_TABLET | ORAL | Status: DC | PRN
  Administered 2018-02-05 – 2018-02-06 (×2): 1 via ORAL
  Filled 2018-02-05 (×2): qty 1

## 2018-02-05 MED ORDER — VANCOMYCIN HCL IN DEXTROSE 1-5 GM/200ML-% IV SOLN
INTRAVENOUS | Status: AC
  Filled 2018-02-05: qty 200

## 2018-02-05 MED ORDER — LIDOCAINE HCL (PF) 1 % IJ SOLN
INTRAMUSCULAR | Status: AC
  Filled 2018-02-05: qty 30

## 2018-02-05 MED ORDER — CARVEDILOL 3.125 MG PO TABS
6.2500 mg | ORAL_TABLET | Freq: Two times a day (BID) | ORAL | Status: DC
  Administered 2018-02-05: 19:00:00 6.25 mg via ORAL
  Filled 2018-02-05: qty 2

## 2018-02-05 MED ORDER — HEPARIN (PORCINE) IN NACL 1000-0.9 UT/500ML-% IV SOLN
INTRAVENOUS | Status: AC
  Filled 2018-02-05: qty 500

## 2018-02-05 MED ORDER — VANCOMYCIN HCL IN DEXTROSE 1-5 GM/200ML-% IV SOLN
1000.0000 mg | Freq: Two times a day (BID) | INTRAVENOUS | Status: AC
  Administered 2018-02-06: 1000 mg via INTRAVENOUS
  Filled 2018-02-05: qty 200

## 2018-02-05 MED ORDER — FENTANYL CITRATE (PF) 100 MCG/2ML IJ SOLN
INTRAMUSCULAR | Status: AC
  Filled 2018-02-05: qty 2

## 2018-02-05 MED ORDER — MUPIROCIN 2 % EX OINT
TOPICAL_OINTMENT | CUTANEOUS | Status: AC
  Administered 2018-02-05: 1
  Filled 2018-02-05: qty 22

## 2018-02-05 MED ORDER — MIDAZOLAM HCL 5 MG/5ML IJ SOLN
INTRAMUSCULAR | Status: DC | PRN
  Administered 2018-02-05 (×2): 1 mg via INTRAVENOUS

## 2018-02-05 MED ORDER — SODIUM CHLORIDE 0.9 % IV SOLN
INTRAVENOUS | Status: AC
  Filled 2018-02-05: qty 2

## 2018-02-05 MED ORDER — FENTANYL CITRATE (PF) 100 MCG/2ML IJ SOLN
INTRAMUSCULAR | Status: DC | PRN
  Administered 2018-02-05 (×3): 25 ug via INTRAVENOUS

## 2018-02-05 MED ORDER — ALBUTEROL SULFATE (2.5 MG/3ML) 0.083% IN NEBU: 3.0000 mL | INHALATION_SOLUTION | Freq: Four times a day (QID) | RESPIRATORY_TRACT | Status: DC | PRN

## 2018-02-05 MED ORDER — ACETAMINOPHEN 325 MG PO TABS: 325.0000 mg | ORAL_TABLET | ORAL | Status: DC | PRN

## 2018-02-05 MED ORDER — HEPARIN (PORCINE) IN NACL 2-0.9 UNITS/ML
INTRAMUSCULAR | Status: AC | PRN
  Administered 2018-02-05: 500 mL

## 2018-02-05 SURGICAL SUPPLY — 7 items
CABLE SURGICAL S-101-97-12 (CABLE) ×2 IMPLANT
ICD VISIA MRI VR DVFB1D4 (ICD Generator) ×1 IMPLANT
LEAD SPRINT QUAT SEC 6935M-62 (Lead) ×2 IMPLANT
PAD DEFIB LIFELINK (PAD) ×2 IMPLANT
SHEATH CLASSIC 9F (SHEATH) ×2 IMPLANT
TRAY PACEMAKER INSERTION (PACKS) ×2 IMPLANT
VISIA MRI VR DVFB1D4 (ICD Generator) ×2 IMPLANT

## 2018-02-05 NOTE — Op Note (Signed)
Procedure report  Procedure performed:  1. Implantation of new single chamber cardioverter defibrillator 2. Fluoroscopy 3. Moderate sedation  Reason for procedure: Primary prevention of sudden cardiac death Nonischemic cardiomyopathy,  left ventricular ejection fraction less than 35%, Heart failure NYHA class 2, on comprehensive medical therapy for over 90 days (SCD-HeFT)  Procedure performed by: Sanda Klein, MD  Complications: None  Estimated blood loss: <10 mL  Medications administered during procedure: Vancomycin 1 g intravenously Lidocaine 1% 30 mL locally,  Fentanyl 75 mcg intravenously Versed 2 mg intravenously  During this procedure the patient is administered a total of Versed 2 mg and Fentanyl 75 mcg IV to achieve and maintain moderate conscious sedation.  The patient's heart rate, blood pressure, and oxygen saturation are monitored continuously during the procedure. The period of conscious sedation is 34 minutes, of which I was present face-to-face 100% of this time.   Device details:  Generator Medtronic Visia AF MRI VR model [id number] serial number [id number] H Right ventricular lead Medtronic [id number] serial number [id number]  Procedure details:  After the risks and benefits of the procedure were discussed the patient provided informed consent and was brought to the cardiac cath lab in the fasting state. The patient was prepped and draped in usual sterile fashion. Local anesthesia with 1% lidocaine was administered to to the left infraclavicular area. A 5-6 cm horizontal incision was made parallel with and 2-3 cm caudal to the left clavicle. Using electrocautery and blunt dissection a prepectoral pocket was created down to the level of the pectoralis major muscle fascia. The pocket was carefully inspected for hemostasis. An antibiotic-soaked sponge was placed in the pocket.  Under fluoroscopic guidance and using the modified Seldinger technique a single venipuncture  was performed to access the left subclavian vein. No difficulty was encountered accessing the vein.  A J-tip guidewire was subsequently exchanged for a 9.5 [place] safe sheath.  Under fluoroscopic guidance the ventricular lead was advanced to level of the mid to apical right ventricular septum and thet active-fixation helix was deployed. Prominent current of injury was seen. Satisfactory pacing and sensing parameters were recorded. There was no evidence of diaphragmatic stimulation at maximum device output. The safe sheath was peeled away and the lead was secured in place with 2-0 silk.  The antibiotic-soaked sponge was removed from the pocket. The pocket was flushed with copious amounts of antibiotic solution. Reinspection showed excellent hemostasis.  The ventricular lead was connected to the generator and appropriate ventricular pacing was seen. Telemetry testing of the lead parameters showed excellent values.  The entire system was then carefully inserted in the pocket with care been taking that the leads and device assumed a comfortable position without pressure on the incision. Great care was taken that the leads be located deep to the generator. The pocket was then closed in layers using 2 layers of 2-0 Vicryl and cutaneous staples, after which a sterile dressing was applied.  At the end of the procedure the following lead parameters were encountered: Right ventricular lead sensed R waves 5.3 mV, impedance 589 ohm, threshold 0.5 V at 0.4 ms pulse width.  High voltage impedance 66 ohm.  Sanda Klein, MD, [facility] [phone] office [phone] pager

## 2018-02-05 NOTE — H&P (Signed)
Cardiology Admission History and Physical:   Patient ID: Cesar Campbell; MRN: [id number]; DOB: 11-28-1945   Admission date: 02/05/2018  Primary Care Provider: Quintin Alto, MD Primary Cardiologist: Cesar Burow, MD    Chief Complaint:  ICD implantation  Patient Profile:   Cesar Campbell is a 72 y.o. male with a history of chemotherapy-related cardiomyopathy here for Primary prevention ICD implantation for non-ischemic cardiomyopathy (left ventricular ejection fraction under 35%, heart failure NYHA class II-III, on comprehensive medical therapy).  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  He is routinely followed at the [facility] in [place].  His ejection fraction has been estimated at 20% and has not improved despite treatment with maximum tolerated doses of ACE inhibitor and beta-blocker.  His typical systolic blood pressure is around 100, he occasionally has dizziness, there does not appear to be room for additional heart failure therapy.  Despite this he has good functional status, NYHA class II and does not require loop diuretics.  His echocardiogram also confirmed evidence of diastolic dysfunction with elevated filling pressures, but he denies shortness of breath at rest or with usual activity.  There are no significant valvular abnormalities on his echo, the pulmonary pressure was mildly elevated.  He had a PICC line in his left antecubital vein for chemotherapy, which has been since removed.  He has never had angina pectoris.  His EKG does not show any ischemic changes and in fact is remarkably normal, with narrow QRS and normal QT interval.  He has been wearing a LifeVest for the last 5 months, no interventions to date.    Past Medical History:  Diagnosis Date  . CHF (congestive heart failure) ([place])   . NHL (non-Hodgkin's lymphoma) [facility])     Past Surgical History:    Procedure Laterality Date  . PICC LINE INSERTION Left      Medications Prior to Admission: Prior to Admission medications   Medication Sig Start Date End Date Taking? Authorizing Provider  albuterol (PROVENTIL HFA;VENTOLIN HFA) 108 (90 Base) MCG/ACT inhaler Inhale 2 puffs every 6 (six) hours as needed into the lungs for wheezing or shortness of breath.    [provider]  carvedilol (COREG) 12.5 MG tablet Take 6.25 mg by mouth 2 (two) times daily with a meal.    [provider]  finasteride (PROSCAR) 5 MG tablet Take 5 mg by mouth daily.    [provider]  furosemide (LASIX) 40 MG tablet Take 1 tablet (40 mg total) daily by mouth. 07/24/17   Brand Males, MD  lisinopril (PRINIVIL,ZESTRIL) 5 MG tablet Take 5 mg by mouth daily.    [provider]  tamsulosin (FLOMAX) 0.4 MG CAPS capsule Take 0.4 mg by mouth daily after supper.    [provider]     Allergies:    Allergies  Allergen Reactions  . Penicillins Hives    Social History:   Social History   Socioeconomic History  . Marital status: Widowed    Spouse name: Not on file  . Number of children: Not on file  . Years of education: Not on file  . Highest education level: Not on file  Occupational History  . Not on file  Social Needs  . Financial resource strain: Not on file  . Food insecurity:    Worry: Not on file    Inability: Not on file  . Transportation needs:    Medical: Not on file  Non-medical: Not on file  Tobacco Use  . Smoking status: Never Smoker  . Smokeless tobacco: Never Used  Substance and Sexual Activity  . Alcohol use: Not on file  . Drug use: Not on file  . Sexual activity: Not on file  Lifestyle  . Physical activity:    Days per week: Not on file    Minutes per session: Not on file  . Stress: Not on file  Relationships  . Social connections:    Talks on phone: Not on file    Gets together: Not on file    Attends religious service: Not on  file    Active member of club or organization: Not on file    Attends meetings of clubs or organizations: Not on file    Relationship status: Not on file  . Intimate partner violence:    Fear of current or ex partner: Not on file    Emotionally abused: Not on file    Physically abused: Not on file    Forced sexual activity: Not on file  Other Topics Concern  . Not on file  Social History Narrative  . Not on file    Family History:   The patient's family history includes Heart attack in his father.    ROS:  Please see the history of present illness.  All other ROS reviewed and negative.     Physical Exam/Data:   Vitals:   02/05/18 1200  BP: 135/85  Pulse: 77  Resp: 18  Temp: 98.3 F (36.8 C)  TempSrc: Oral  SpO2: 100%  Weight: 189 lb (85.7 kg)  Height: 6\' 3"  (1.905 m)   No intake or output data in the 24 hours ending 02/05/18 1257 Filed Weights   02/05/18 1200  Weight: 189 lb (85.7 kg)   Body mass index is 23.62 kg/m.  General:  Well nourished, well developed, in no acute distress HEENT: normal Lymph: no adenopathy Neck: no JVD Endocrine:  No thryomegaly Vascular: No carotid bruits; FA pulses 2+ bilaterally without bruits  Cardiac:  normal S1, S2; RRR; no murmur  Lungs:  clear to auscultation bilaterally, no wheezing, rhonchi or rales  Abd: soft, nontender, no hepatomegaly  Ext: no edema Musculoskeletal:  No deformities, BUE and BLE strength normal and equal Skin: warm and dry  Neuro:  CNs 2-12 intact, no focal abnormalities noted Psych:  Normal affect    EKG:  The ECG that was done today was personally reviewed and demonstrates NSR, left axis, narrow QRS 90 ms   Laboratory Data:  Chemistry Recent Labs  Lab 01/30/18 1440  NA 141  K 5.1  CL 102  CO2 24  GLUCOSE 104*  BUN 38*  CREATININE 1.64*  CALCIUM 9.5  GFRNONAA 41*  GFRAA 47*    No results for input(s): PROT, ALBUMIN, AST, ALT, ALKPHOS, BILITOT in the last 168 hours. Hematology Recent  Labs  Lab 01/30/18 1440  WBC 3.5  RBC 4.15  HGB 11.9*  HCT 35.5*  MCV 86  MCH 28.7  MCHC 33.5  RDW 14.4  PLT 87*   Cardiac EnzymesNo results for input(s): TROPONINI in the last 168 hours. No results for input(s): TROPIPOC in the last 168 hours.  BNPNo results for input(s): BNP, PROBNP in the last 168 hours.  DDimer No results for input(s): DDIMER in the last 168 hours.  Radiology/Studies:  No results found.  Assessment and Plan:   1. Mr. Mcinerny meets criteria for primary prevention ICD implantation for non ischemic cardiomyopathy (  left ventricular ejection fraction under 35%, heart failure NYHA class II-III, on comprehensive medical therapy). This procedure has been fully reviewed with the patient and written informed consent has been obtained.   For questions or updates, please contact [place] Please consult [email or web address] for contact info under Cardiology/STEMI.    Signed, Sanda Klein, MD  02/05/2018 12:57 PM

## 2018-02-06 ENCOUNTER — Ambulatory Visit (HOSPITAL_COMMUNITY): Payer: No Typology Code available for payment source

## 2018-02-06 ENCOUNTER — Encounter (HOSPITAL_COMMUNITY): Payer: Self-pay | Admitting: Cardiovascular Disease

## 2018-02-06 DIAGNOSIS — Z9221 Personal history of antineoplastic chemotherapy: Secondary | ICD-10-CM | POA: Diagnosis not present

## 2018-02-06 DIAGNOSIS — I428 Other cardiomyopathies: Secondary | ICD-10-CM | POA: Diagnosis not present

## 2018-02-06 DIAGNOSIS — Z880 Allergy status to penicillin: Secondary | ICD-10-CM | POA: Diagnosis not present

## 2018-02-06 DIAGNOSIS — I509 Heart failure, unspecified: Secondary | ICD-10-CM | POA: Diagnosis not present

## 2018-02-06 MED ORDER — METOPROLOL TARTRATE 25 MG PO TABS: 25.0000 mg | ORAL_TABLET | Freq: Three times a day (TID) | ORAL | Status: DC

## 2018-02-06 MED ORDER — HYDROCODONE-ACETAMINOPHEN 5-325 MG PO TABS: 1.0000 | ORAL_TABLET | ORAL | 0 refills | Status: DC | PRN

## 2018-02-06 MED ORDER — CARVEDILOL 3.125 MG PO TABS
6.2500 mg | ORAL_TABLET | Freq: Two times a day (BID) | ORAL | Status: DC
  Administered 2018-02-06: 6.25 mg via ORAL
  Filled 2018-02-06: qty 2

## 2018-02-06 NOTE — Progress Notes (Addendum)
   Progress Note  Patient Name: Cesar Campbell Date of Encounter: 02/06/2018  Primary Cardiologist: Quay Burow, MD   Subjective   A little sore, pain meds took care of it.  Inpatient Medications    Scheduled Meds: . carvedilol  6.25 mg Oral BID WC  . finasteride  5 mg Oral Daily  . furosemide  40 mg Oral Daily  . lisinopril  5 mg Oral Daily  . sodium chloride flush  3 mL Intravenous Q12H  . tamsulosin  0.4 mg Oral QPC supper   Continuous Infusions: . sodium chloride     PRN Meds: sodium chloride, acetaminophen, albuterol, HYDROcodone-acetaminophen, ondansetron (ZOFRAN) IV, sodium chloride flush   Vital Signs    Vitals:   02/06/18 0000 02/06/18 0400 02/06/18 0420 02/06/18 0604  BP: 103/63 (!) 83/51 (!) 89/64 106/69  Pulse: 75 81 71 89  Resp: 19 (!) 8 18 18   Temp:  97.7 F (36.5 C)    TempSrc:  Oral    SpO2: 99% 100% 99% 99%  Weight:  191 lb 12.8 oz (87 kg)    Height:        Intake/Output Summary (Last 24 hours) at 02/06/2018 0738 Last data filed at 02/06/2018 0600 Gross per 24 hour  Intake 320 ml  Output 300 ml  Net 20 ml   Filed Weights   02/05/18 1200 02/06/18 0400  Weight: 189 lb (85.7 kg) 191 lb 12.8 oz (87 kg)    Telemetry    NSR - Personally Reviewed  ECG    NSR, prolonged QT - Personally Reviewed  Physical Exam  Minimal oozing, no hematoma at surgical site GEN: No acute distress.   Neck: No JVD Cardiac: RRR, no murmurs, rubs, or gallops.  Respiratory: Clear to auscultation bilaterally. GI: Soft, nontender, non-distended  MS: No edema; No deformity. Neuro:  Nonfocal  Psych: Normal affect   Labs    Chemistry Recent Labs  Lab 01/30/18 1440  NA 141  K 5.1  CL 102  CO2 24  GLUCOSE 104*  BUN 38*  CREATININE 1.64*  CALCIUM 9.5  GFRNONAA 41*  GFRAA 47*     Hematology Recent Labs  Lab 01/30/18 1440  WBC 3.5  RBC 4.15  HGB 11.9*  HCT 35.5*  MCV 86  MCH 28.7  MCHC 33.5  RDW 14.4  PLT 87*    Cardiac EnzymesNo results  for input(s): TROPONINI in the last 168 hours. No results for input(s): TROPIPOC in the last 168 hours.   BNPNo results for input(s): BNP, PROBNP in the last 168 hours.   DDimer No results for input(s): DDIMER in the last 168 hours.   Radiology    No results found.  On my review, no pneumothorax, lead position ok.  Cardiac Studies   R waves 5.5 mV bipolar, impedance 532 ohm, threshold 0.5V@0 .4 ms  Patient Profile     72 y.o. male s/p elective primary prevention ICD implantation for NIDCMP.  Assessment & Plan    DC home. Wound care and activity restrictions and shock plan reviewed. Wound check 10-14 days. Office visit 3 months.  For questions or updates, please contact [place] Please consult [email or web address] for contact info under Cardiology/STEMI.      Signed, Sanda Klein, MD  02/06/2018, 7:38 AM

## 2018-02-06 NOTE — Discharge Summary (Signed)
Discharge Summary    Patient ID: Cesar Campbell,  MRN: [id number], DOB/AGE: 73-Aug-1947 73 y.o.  Admit date: 02/05/2018 Discharge date: 02/06/2018  Primary Care Provider: [facility] Primary Cardiologist: Quay Burow, MD  Discharge Diagnoses    Principal Problem:   Nonischemic cardiomyopathy [facility]) Active Problems:   ICD (implantable cardioverter-defibrillator) in place   Allergies Allergies  Allergen Reactions  . Penicillins Hives    Has patient had a PCN reaction causing immediate rash, facial/tongue/throat swelling, SOB or lightheadedness with hypotension: Yes Has patient had a PCN reaction causing severe rash involving mucus membranes or skin necrosis: No Has patient had a PCN reaction that required hospitalization: No Has patient had a PCN reaction occurring within the last 10 years: No If all of the above answers are "NO", then may proceed with Cephalosporin use.     Diagnostic Studies/Procedures    ICD implantation: 02/05/18 Conclusion    1. Implantation of new single chamber cardioverter defibrillator 2. Fluoroscopy 3. Moderate sedation   Device details:  Generator Medtronic Visia AF MRI VR model [id number] serial number [id number] Right ventricular lead Medtronic [id number] serial number [id number] _____________   History of Present Illness     Cesar Campbell is a 72 y.o. 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  He is routinely followed at the [facility] in [place].  His ejection fraction has been estimated at 20% and has not improved despite treatment with maximum tolerated doses of ACE inhibitor and beta-blocker.  His typical systolic blood pressure is around 100, he occasionally has dizziness, there does not appear to be room for additional heart failure therapy.  Despite this he has good functional status, NYHA class II and does not require loop diuretics.  His  echocardiogram also confirmed evidence of diastolic dysfunction with elevated filling pressures, but he denies shortness of breath at rest or with usual activity.  There are no significant valvular abnormalities on his echo, the pulmonary pressure was mildly elevated.  He had a PICC line in his left antecubital vein for chemotherapy, which has been since removed.  He has never had angina pectoris.  His EKG does not show any ischemic changes and in fact is remarkably normal, with narrow QRS and normal QT interval.  He has been wearing a LifeVest for the last 5 months, no interventions to date.    Hospital Course     Consultants: None   1. Non-ischemic cardiomyopathy: severe NICM 2/2 cardiotoxicity from chemotherapy for non-Hodgkins lymphoma. EF 20%. He was recommended to undergo ICD placement which occurred 01/03/61 without complications. CXR post procedure without PTX.  - Home medications continued at discharge - Pain controlled with po vicoden - given rx for #5 tabs at discharge - Follow-up appointments scheduled  _____________  Discharge Vitals Blood pressure 105/67, pulse 66, temperature 97.6 F (36.4 C), temperature source Oral, resp. rate 15, height 6\' 3"  (1.905 m), weight 191 lb 12.8 oz (87 kg), SpO2 100 %.  Filed Weights   02/05/18 1200 02/06/18 0400  Weight: 189 lb (85.7 kg) 191 lb 12.8 oz (87 kg)    Labs & Radiologic Studies    CBC No results for input(s): WBC, NEUTROABS, HGB, HCT, MCV, PLT in the last 72 hours. Basic Metabolic Panel No results for input(s): NA, K, CL, CO2, GLUCOSE, BUN, CREATININE, CALCIUM, MG, PHOS in the last 72 hours. Liver Function Tests No results for input(s): AST, ALT, ALKPHOS, BILITOT, PROT, ALBUMIN in  the last 72 hours. No results for input(s): LIPASE, AMYLASE in the last 72 hours. Cardiac Enzymes No results for input(s): CKTOTAL, CKMB, CKMBINDEX, TROPONINI in the last 72 hours. BNP Invalid input(s): POCBNP D-Dimer No results for input(s):  DDIMER in the last 72 hours. Hemoglobin A1C No results for input(s): HGBA1C in the last 72 hours. Fasting Lipid Panel No results for input(s): CHOL, HDL, LDLCALC, TRIG, CHOLHDL, LDLDIRECT in the last 72 hours. Thyroid Function Tests No results for input(s): TSH, T4TOTAL, T3FREE, THYROIDAB in the last 72 hours.  Invalid input(s): FREET3 _____________  No results found. Disposition   Patient was seen and examined by Dr. Sallyanne Kuster who deemed patient as stable for discharge. Follow-up has been arranged. Discharge medications as listed below.   Follow-up Plans & Appointments    Follow-up Information    Croitoru, Mihai, MD Follow up on 05/08/2018.   Specialty:  Cardiology Why:  Please arrive 15 minutes early for your 9:20am appointment Contact information: [address] [place] [address] [phone]        [facility] Follow up on 02/17/2018.   Specialty:  Cardiology Why:  Please arrive 15 minutes early for your 10:30 am wound check  Contact information: [address] [place]       Bensimhon, Shaune Pascal, MD Follow up on 02/20/2018.   Specialty:  Cardiology Why:  Please arrive 15 minutes early for your 11:40am appointment Contact information: [address] [place] [address] [phone]            Discharge Medications   Allergies as of 02/06/2018      Reactions   Penicillins Hives   Has patient had a PCN reaction causing immediate rash, facial/tongue/throat swelling, SOB or lightheadedness with hypotension: Yes Has patient had a PCN reaction causing severe rash involving mucus membranes or skin necrosis: No Has patient had a PCN reaction that required hospitalization: No Has patient had a PCN reaction occurring within the last 10 years: No If all of the above answers are "NO", then may proceed with Cephalosporin use.      Medication List    TAKE  these medications   albuterol 108 (90 Base) MCG/ACT inhaler Commonly known as:  PROVENTIL HFA;VENTOLIN HFA Inhale 2 puffs every 6 (six) hours as needed into the lungs for wheezing or shortness of breath.   carvedilol 12.5 MG tablet Commonly known as:  COREG Take 6.25 mg by mouth 2 (two) times daily with a meal.   finasteride 5 MG tablet Commonly known as:  PROSCAR Take 5 mg by mouth daily.   furosemide 40 MG tablet Commonly known as:  LASIX Take 1 tablet (40 mg total) daily by mouth.   HYDROcodone-acetaminophen 5-325 MG tablet Commonly known as:  NORCO/VICODIN Take 1-2 tablets by mouth every 4 (four) hours as needed for moderate pain.   lisinopril 5 MG tablet Commonly known as:  PRINIVIL,ZESTRIL Take 5 mg by mouth daily.   tamsulosin 0.4 MG Caps capsule Commonly known as:  FLOMAX Take 0.4 mg by mouth daily after supper.       Outstanding Labs/Studies   None  Duration of Discharge Encounter   Greater than 30 minutes including physician time.  Signed, Abigail Butts PA-C 02/06/2018, 9:06 AM

## 2018-02-06 NOTE — Discharge Instructions (Signed)

## 2018-02-17 ENCOUNTER — Encounter (HOSPITAL_COMMUNITY): Payer: No Typology Code available for payment source | Admitting: Internal Medicine

## 2018-02-17 ENCOUNTER — Ambulatory Visit: Payer: Non-veteran care

## 2018-02-19 ENCOUNTER — Ambulatory Visit (INDEPENDENT_AMBULATORY_CARE_PROVIDER_SITE_OTHER): Payer: No Typology Code available for payment source | Admitting: *Deleted

## 2018-02-19 DIAGNOSIS — I428 Other cardiomyopathies: Secondary | ICD-10-CM | POA: Diagnosis not present

## 2018-02-19 LAB — CUP PACEART INCLINIC DEVICE CHECK
Battery Remaining Longevity: 137 mo
Battery Voltage: 3.13 V
HighPow Impedance: 68 Ohm
Implantable Lead Location: 753860
Implantable Pulse Generator Implant Date: 20190522
Lead Channel Impedance Value: 399 Ohm
Lead Channel Pacing Threshold Amplitude: 0.75 V
Lead Channel Pacing Threshold Pulse Width: 0.4 ms
Lead Channel Setting Pacing Amplitude: 3.5 V
Lead Channel Setting Pacing Pulse Width: 0.4 ms
MDC IDC LEAD IMPLANT DT: 20190522
MDC IDC MSMT LEADCHNL RV IMPEDANCE VALUE: 456 Ohm
MDC IDC MSMT LEADCHNL RV SENSING INTR AMPL: 6.625 mV
MDC IDC MSMT LEADCHNL RV SENSING INTR AMPL: 7.125 mV
MDC IDC SESS DTM: 20190605171951
MDC IDC SET LEADCHNL RV SENSING SENSITIVITY: 0.3 mV
MDC IDC STAT BRADY RV PERCENT PACED: 0 %

## 2018-02-19 NOTE — Progress Notes (Signed)
Wound check appointment. Staples removed. Wound without redness or edema. Incision edges approximated, wound well healed. Normal device function. Threshold, sensing, and impedances consistent with implant measurements. Device programmed at 3.5V for extra safety margin until 3 month visit. Histogram distribution appropriate for patient and level of activity. No atrial or ventricular arrhythmias noted. Patient educated about wound care, arm mobility, lifting restrictions, shock plan. ROV on w/MC on 05/08/18.

## 2018-02-20 ENCOUNTER — Encounter (HOSPITAL_COMMUNITY): Payer: Self-pay | Admitting: *Deleted

## 2018-02-20 ENCOUNTER — Ambulatory Visit (HOSPITAL_COMMUNITY)
Admission: RE | Admit: 2018-02-20 | Discharge: 2018-02-20 | Disposition: A | Discharge status: Home / Self Care | Payer: No Typology Code available for payment source | Source: Ambulatory Visit | Attending: Internal Medicine | Admitting: Internal Medicine

## 2018-02-20 ENCOUNTER — Encounter (HOSPITAL_COMMUNITY): Payer: Self-pay

## 2018-02-20 ENCOUNTER — Encounter (HOSPITAL_COMMUNITY): Payer: Self-pay | Admitting: Internal Medicine

## 2018-02-20 ENCOUNTER — Other Ambulatory Visit (HOSPITAL_COMMUNITY): Payer: Self-pay | Admitting: *Deleted

## 2018-02-20 ENCOUNTER — Other Ambulatory Visit: Payer: Self-pay

## 2018-02-20 VITALS — BP 108/73 | HR 78 | Wt 194.8 lb

## 2018-02-20 DIAGNOSIS — I5022 Chronic systolic (congestive) heart failure: Secondary | ICD-10-CM

## 2018-02-20 DIAGNOSIS — Z9221 Personal history of antineoplastic chemotherapy: Secondary | ICD-10-CM | POA: Diagnosis not present

## 2018-02-20 DIAGNOSIS — I428 Other cardiomyopathies: Secondary | ICD-10-CM

## 2018-02-20 DIAGNOSIS — Z8249 Family history of ischemic heart disease and other diseases of the circulatory system: Secondary | ICD-10-CM | POA: Insufficient documentation

## 2018-02-20 DIAGNOSIS — Z87442 Personal history of urinary calculi: Secondary | ICD-10-CM | POA: Insufficient documentation

## 2018-02-20 DIAGNOSIS — Z95810 Presence of automatic (implantable) cardiac defibrillator: Secondary | ICD-10-CM | POA: Insufficient documentation

## 2018-02-20 DIAGNOSIS — Z79899 Other long term (current) drug therapy: Secondary | ICD-10-CM | POA: Insufficient documentation

## 2018-02-20 DIAGNOSIS — Z880 Allergy status to penicillin: Secondary | ICD-10-CM | POA: Diagnosis not present

## 2018-02-20 DIAGNOSIS — Z8572 Personal history of non-Hodgkin lymphomas: Secondary | ICD-10-CM | POA: Insufficient documentation

## 2018-02-20 DIAGNOSIS — I427 Cardiomyopathy due to drug and external agent: Secondary | ICD-10-CM | POA: Insufficient documentation

## 2018-02-20 LAB — BASIC METABOLIC PANEL
ANION GAP: 7 (ref 5–15)
BUN: 27 mg/dL — ABNORMAL HIGH (ref 6–20)
CHLORIDE: 105 mmol/L (ref 101–111)
CO2: 27 mmol/L (ref 22–32)
CREATININE: 1.66 mg/dL — AB (ref 0.61–1.24)
Calcium: 9.1 mg/dL (ref 8.9–10.3)
GFR calc non Af Amer: 39 mL/min — ABNORMAL LOW (ref >60)
GFR, EST AFRICAN AMERICAN: 46 mL/min — AB (ref >60)
Glucose, Bld: 101 mg/dL — ABNORMAL HIGH (ref 65–99)
Potassium: 5.4 mmol/L — ABNORMAL HIGH (ref 3.5–5.1)
SODIUM: 139 mmol/L (ref 135–145)

## 2018-02-20 LAB — CBC
HCT: 37.8 % — ABNORMAL LOW (ref 39.0–52.0)
HEMOGLOBIN: 12 g/dL — AB (ref 13.0–17.0)
MCH: 28.1 pg (ref 26.0–34.0)
MCHC: 31.7 g/dL (ref 30.0–36.0)
MCV: 88.5 fL (ref 78.0–100.0)
PLATELETS: 84 10*3/uL — AB (ref 150–400)
RBC: 4.27 MIL/uL (ref 4.22–5.81)
RDW: 13.1 % (ref 11.5–15.5)
WBC: 4 10*3/uL (ref 4.0–10.5)

## 2018-02-20 LAB — PROTIME-INR
INR: 1.15
Prothrombin Time: 14.6 seconds (ref 11.4–15.2)
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
## 2018-02-20 NOTE — Patient Instructions (Addendum)
Your physician has requested that you have a cardiac catheterization. Cardiac catheterization is used to diagnose and/or treat various heart conditions. Doctors may recommend this procedure for a number of different reasons. The most common reason is to evaluate chest pain. Chest pain can be a symptom of coronary artery disease (CAD), and cardiac catheterization can show whether plaque is narrowing or blocking your heart's arteries. This procedure is also used to evaluate the valves, as well as measure the blood flow and oxygen levels in different parts of your heart. For further information please visit [email or web address]. Please follow instruction sheet, as given.  Please Pick day for procedure in June: 10th, 14th, 17th, and 21 st.   Call [phone] opt [phone]  Your physician recommends that you schedule a follow-up appointment in: 4-6 weeks in APP -Call to make appointment.

## 2018-03-07 ENCOUNTER — Encounter (HOSPITAL_COMMUNITY)
Admission: RE | Disposition: A | Discharge status: Home / Self Care | Payer: Self-pay | Source: Ambulatory Visit | Attending: Internal Medicine

## 2018-03-07 ENCOUNTER — Encounter (HOSPITAL_COMMUNITY): Payer: Self-pay | Admitting: Internal Medicine

## 2018-03-07 ENCOUNTER — Ambulatory Visit (HOSPITAL_COMMUNITY)
Admission: RE | Admit: 2018-03-07 | Discharge: 2018-03-07 | Disposition: A | Discharge status: Home / Self Care | Payer: No Typology Code available for payment source | Source: Ambulatory Visit | Attending: Internal Medicine | Admitting: Internal Medicine

## 2018-03-07 DIAGNOSIS — I5022 Chronic systolic (congestive) heart failure: Secondary | ICD-10-CM | POA: Diagnosis not present

## 2018-03-07 DIAGNOSIS — Z87828 Personal history of other (healed) physical injury and trauma: Secondary | ICD-10-CM | POA: Insufficient documentation

## 2018-03-07 DIAGNOSIS — I2510 Atherosclerotic heart disease of native coronary artery without angina pectoris: Secondary | ICD-10-CM | POA: Diagnosis not present

## 2018-03-07 DIAGNOSIS — Z9221 Personal history of antineoplastic chemotherapy: Secondary | ICD-10-CM | POA: Diagnosis not present

## 2018-03-07 DIAGNOSIS — Z8249 Family history of ischemic heart disease and other diseases of the circulatory system: Secondary | ICD-10-CM | POA: Insufficient documentation

## 2018-03-07 DIAGNOSIS — Z8572 Personal history of non-Hodgkin lymphomas: Secondary | ICD-10-CM | POA: Diagnosis not present

## 2018-03-07 DIAGNOSIS — I427 Cardiomyopathy due to drug and external agent: Secondary | ICD-10-CM | POA: Insufficient documentation

## 2018-03-07 DIAGNOSIS — I428 Other cardiomyopathies: Secondary | ICD-10-CM

## 2018-03-07 DIAGNOSIS — Z880 Allergy status to penicillin: Secondary | ICD-10-CM | POA: Insufficient documentation

## 2018-03-07 DIAGNOSIS — Z95810 Presence of automatic (implantable) cardiac defibrillator: Secondary | ICD-10-CM | POA: Diagnosis not present

## 2018-03-07 DIAGNOSIS — Z87442 Personal history of urinary calculi: Secondary | ICD-10-CM | POA: Insufficient documentation

## 2018-03-07 DIAGNOSIS — Z79899 Other long term (current) drug therapy: Secondary | ICD-10-CM | POA: Insufficient documentation

## 2018-03-07 HISTORY — PX: RIGHT/LEFT HEART CATH AND CORONARY ANGIOGRAPHY: CATH118266

## 2018-03-07 LAB — POCT I-STAT 3, VENOUS BLOOD GAS (G3P V)
ACID-BASE DEFICIT: 2 mmol/L (ref 0.0–2.0)
ACID-BASE DEFICIT: 5 mmol/L — AB (ref 0.0–2.0)
BICARBONATE: 20.8 mmol/L (ref 20.0–28.0)
Bicarbonate: 24.2 mmol/L (ref 20.0–28.0)
O2 SAT: 67 %
O2 Saturation: 69 %
PCO2 VEN: 41.3 mmHg — AB (ref 44.0–60.0)
PH VEN: 7.312 (ref 7.250–7.430)
PH VEN: 7.344 (ref 7.250–7.430)
PO2 VEN: 38 mmHg (ref 32.0–45.0)
TCO2: 22 mmol/L (ref 22–32)
TCO2: 26 mmol/L (ref 22–32)
pCO2, Ven: 44.3 mmHg (ref 44.0–60.0)
pO2, Ven: 38 mmHg (ref 32.0–45.0)

## 2018-03-07 LAB — POCT I-STAT 3, ART BLOOD GAS (G3+)
Acid-base deficit: 1 mmol/L (ref 0.0–2.0)
Bicarbonate: 23 mmol/L (ref 20.0–28.0)
O2 Saturation: 98 %
PCO2 ART: 35.3 mmHg (ref 32.0–48.0)
PH ART: 7.421 (ref 7.350–7.450)
PO2 ART: 98 mmHg (ref 83.0–108.0)
TCO2: 24 mmol/L (ref 22–32)

## 2018-03-07 LAB — BASIC METABOLIC PANEL
ANION GAP: 6 (ref 5–15)
BUN: 32 mg/dL — ABNORMAL HIGH (ref 6–20)
CALCIUM: 8.8 mg/dL — AB (ref 8.9–10.3)
CHLORIDE: 109 mmol/L (ref 101–111)
CO2: 27 mmol/L (ref 22–32)
CREATININE: 1.68 mg/dL — AB (ref 0.61–1.24)
GFR calc non Af Amer: 39 mL/min — ABNORMAL LOW (ref >60)
GFR, EST AFRICAN AMERICAN: 45 mL/min — AB (ref >60)
Glucose, Bld: 104 mg/dL — ABNORMAL HIGH (ref 65–99)
Potassium: 4.8 mmol/L (ref 3.5–5.1)
SODIUM: 142 mmol/L (ref 135–145)

## 2018-03-07 SURGERY — RIGHT/LEFT HEART CATH AND CORONARY ANGIOGRAPHY
Anesthesia: LOCAL

## 2018-03-07 MED ORDER — SODIUM CHLORIDE 0.9 % IV SOLN: 250.0000 mL | INTRAVENOUS | Status: DC | PRN

## 2018-03-07 MED ORDER — MIDAZOLAM HCL 2 MG/2ML IJ SOLN
INTRAMUSCULAR | Status: DC | PRN
  Administered 2018-03-07 (×2): 1 mg via INTRAVENOUS

## 2018-03-07 MED ORDER — SODIUM CHLORIDE 0.9% FLUSH: 3.0000 mL | INTRAVENOUS | Status: DC | PRN

## 2018-03-07 MED ORDER — ASPIRIN 81 MG PO CHEW
81.0000 mg | CHEWABLE_TABLET | ORAL | Status: AC
  Administered 2018-03-07: 81 mg via ORAL

## 2018-03-07 MED ORDER — SODIUM CHLORIDE 0.9 % IV SOLN
INTRAVENOUS | Status: DC
  Administered 2018-03-07: 10:00:00 via INTRAVENOUS

## 2018-03-07 MED ORDER — HEPARIN SODIUM (PORCINE) 1000 UNIT/ML IJ SOLN
INTRAMUSCULAR | Status: AC
  Filled 2018-03-07: qty 1

## 2018-03-07 MED ORDER — ACETAMINOPHEN 325 MG PO TABS: 650.0000 mg | ORAL_TABLET | ORAL | Status: DC | PRN

## 2018-03-07 MED ORDER — LIDOCAINE HCL (PF) 1 % IJ SOLN
INTRAMUSCULAR | Status: AC
  Filled 2018-03-07: qty 30

## 2018-03-07 MED ORDER — MIDAZOLAM HCL 2 MG/2ML IJ SOLN
INTRAMUSCULAR | Status: AC
  Filled 2018-03-07: qty 2

## 2018-03-07 MED ORDER — SODIUM CHLORIDE 0.9 % IV SOLN: INTRAVENOUS | Status: AC

## 2018-03-07 MED ORDER — HEPARIN SODIUM (PORCINE) 1000 UNIT/ML IJ SOLN
INTRAMUSCULAR | Status: DC | PRN
  Administered 2018-03-07: 4000 [IU] via INTRAVENOUS

## 2018-03-07 MED ORDER — LIDOCAINE HCL (PF) 1 % IJ SOLN
INTRAMUSCULAR | Status: DC | PRN
  Administered 2018-03-07 (×2): 2 mL

## 2018-03-07 MED ORDER — VERAPAMIL HCL 2.5 MG/ML IV SOLN
INTRAVENOUS | Status: DC | PRN
  Administered 2018-03-07: 10:00:00 via INTRA_ARTERIAL

## 2018-03-07 MED ORDER — FENTANYL CITRATE (PF) 100 MCG/2ML IJ SOLN
INTRAMUSCULAR | Status: DC | PRN
  Administered 2018-03-07: 25 ug via INTRAVENOUS

## 2018-03-07 MED ORDER — SODIUM CHLORIDE 0.9% FLUSH: 3.0000 mL | Freq: Two times a day (BID) | INTRAVENOUS | Status: DC

## 2018-03-07 MED ORDER — HEPARIN (PORCINE) IN NACL 2-0.9 UNITS/ML
INTRAMUSCULAR | Status: AC | PRN
  Administered 2018-03-07: 1000 mL

## 2018-03-07 MED ORDER — HEPARIN (PORCINE) IN NACL 1000-0.9 UT/500ML-% IV SOLN
INTRAVENOUS | Status: AC
  Filled 2018-03-07: qty 500

## 2018-03-07 MED ORDER — IOHEXOL 350 MG/ML SOLN
INTRAVENOUS | Status: DC | PRN
  Administered 2018-03-07: 45 mL via INTRA_ARTERIAL

## 2018-03-07 MED ORDER — ONDANSETRON HCL 4 MG/2ML IJ SOLN: 4.0000 mg | Freq: Four times a day (QID) | INTRAMUSCULAR | Status: DC | PRN

## 2018-03-07 MED ORDER — VERAPAMIL HCL 2.5 MG/ML IV SOLN
INTRAVENOUS | Status: AC
  Filled 2018-03-07: qty 2

## 2018-03-07 MED ORDER — ASPIRIN 81 MG PO CHEW
CHEWABLE_TABLET | ORAL | Status: AC
  Filled 2018-03-07: qty 1

## 2018-03-07 MED ORDER — FENTANYL CITRATE (PF) 100 MCG/2ML IJ SOLN
INTRAMUSCULAR | Status: AC
  Filled 2018-03-07: qty 2

## 2018-03-07 SURGICAL SUPPLY — 10 items
CATH 5FR JL3.5 JR4 ANG PIG MP (CATHETERS) ×2 IMPLANT
CATH BALLN WEDGE 5F 110CM (CATHETERS) ×2 IMPLANT
DEVICE RAD COMP TR BAND LRG (VASCULAR PRODUCTS) ×2 IMPLANT
GLIDESHEATH SLEND SS 6F .021 (SHEATH) ×2 IMPLANT
GUIDEWIRE INQWIRE 1.5J.035X260 (WIRE) ×1 IMPLANT
INQWIRE 1.5J .035X260CM (WIRE) ×2
KIT HEART LEFT (KITS) ×2 IMPLANT
PACK CARDIAC CATHETERIZATION (CUSTOM PROCEDURE TRAY) ×2 IMPLANT
SHEATH GLIDE SLENDER 4/5FR (SHEATH) ×2 IMPLANT
TRANSDUCER W/STOPCOCK (MISCELLANEOUS) ×2 IMPLANT

## 2018-03-07 NOTE — Discharge Instructions (Signed)

## 2018-03-07 NOTE — Interval H&P Note (Signed)
History and Physical Interval Note:  03/07/2018 10:58 AM  Cesar Campbell  has presented today for surgery, with the diagnosis of chf  The various methods of treatment have been discussed with the patient and family. After consideration of risks, benefits and other options for treatment, the patient has consented to  Procedure(s): RIGHT/LEFT HEART CATH AND CORONARY ANGIOGRAPHY (N/A) as a surgical intervention .  The patient's history has been reviewed, patient examined, no change in status, stable for surgery.  I have reviewed the patient's chart and labs.  Questions were answered to the patient's satisfaction.     Daniel Bensimhon
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
## 2018-03-27 ENCOUNTER — Encounter (HOSPITAL_COMMUNITY): Payer: Self-pay

## 2018-03-27 ENCOUNTER — Ambulatory Visit (HOSPITAL_COMMUNITY)
Admission: RE | Admit: 2018-03-27 | Discharge: 2018-03-27 | Disposition: A | Discharge status: Home / Self Care | Payer: No Typology Code available for payment source | Source: Ambulatory Visit | Attending: Cardiology | Admitting: Cardiology

## 2018-03-27 VITALS — BP 114/82 | HR 74 | Wt 203.2 lb

## 2018-03-27 DIAGNOSIS — Z95810 Presence of automatic (implantable) cardiac defibrillator: Secondary | ICD-10-CM | POA: Insufficient documentation

## 2018-03-27 DIAGNOSIS — I2510 Atherosclerotic heart disease of native coronary artery without angina pectoris: Secondary | ICD-10-CM | POA: Diagnosis not present

## 2018-03-27 DIAGNOSIS — Z9221 Personal history of antineoplastic chemotherapy: Secondary | ICD-10-CM | POA: Insufficient documentation

## 2018-03-27 DIAGNOSIS — N179 Acute kidney failure, unspecified: Secondary | ICD-10-CM

## 2018-03-27 DIAGNOSIS — Z87442 Personal history of urinary calculi: Secondary | ICD-10-CM | POA: Diagnosis not present

## 2018-03-27 DIAGNOSIS — C8590 Non-Hodgkin lymphoma, unspecified, unspecified site: Secondary | ICD-10-CM | POA: Insufficient documentation

## 2018-03-27 DIAGNOSIS — I428 Other cardiomyopathies: Secondary | ICD-10-CM | POA: Diagnosis not present

## 2018-03-27 DIAGNOSIS — I5022 Chronic systolic (congestive) heart failure: Secondary | ICD-10-CM | POA: Insufficient documentation

## 2018-03-27 DIAGNOSIS — Z79899 Other long term (current) drug therapy: Secondary | ICD-10-CM | POA: Diagnosis not present

## 2018-03-27 DIAGNOSIS — I427 Cardiomyopathy due to drug and external agent: Secondary | ICD-10-CM | POA: Insufficient documentation

## 2018-03-27 DIAGNOSIS — Z8572 Personal history of non-Hodgkin lymphomas: Secondary | ICD-10-CM | POA: Insufficient documentation

## 2018-03-27 LAB — BASIC METABOLIC PANEL
Anion gap: 5 (ref 5–15)
BUN: 20 mg/dL (ref 8–23)
CALCIUM: 9 mg/dL (ref 8.9–10.3)
CO2: 27 mmol/L (ref 22–32)
CREATININE: 1.5 mg/dL — AB (ref 0.61–1.24)
Chloride: 107 mmol/L (ref 98–111)
GFR, EST AFRICAN AMERICAN: 52 mL/min — AB (ref >60)
GFR, EST NON AFRICAN AMERICAN: 44 mL/min — AB (ref >60)
Glucose, Bld: 100 mg/dL — ABNORMAL HIGH (ref 70–99)
Potassium: 5.1 mmol/L (ref 3.5–5.1)
SODIUM: 139 mmol/L (ref 135–145)

## 2018-03-27 NOTE — Patient Instructions (Signed)
Routine lab work today. Will notify you of abnormal results, otherwise no news is good news!  No changes to medication at this time.  Follow up 2 months with Dr. Haroldine Laws.  _______________________________________________________________ Cesar Campbell Code: 7841  Take all medication as prescribed the day of your appointment. Bring all medications with you to your appointment.  Do the following things EVERYDAY: 1) Weigh yourself in the morning before breakfast. Write it down and keep it in a log. 2) Take your medicines as prescribed 3) Eat low salt foods-Limit salt (sodium) to 2000 mg per day.  4) Stay as active as you can everyday 5) Limit all fluids for the day to less than 2 liters

## 2018-03-28 ENCOUNTER — Telehealth (HOSPITAL_COMMUNITY): Payer: Self-pay

## 2018-03-28 NOTE — Telephone Encounter (Signed)
Result Notes for Basic metabolic panel   Notes recorded by Effie Berkshire, RN on 03/28/2018 at 12:25 PM EDT Attempted to call, no answer. ------  Notes recorded by Shirley Friar, PA-C on 03/27/2018 at 11:36 AM EDT Cr stable. K mildly elevated, only on ramipril. Please encouraged low potassium diet and repeat 2 weeks.     Legrand Como [address] [place], PA-C 03/27/2018 11:36 AM

## 2018-03-31 ENCOUNTER — Telehealth (HOSPITAL_COMMUNITY): Payer: Self-pay

## 2018-03-31 NOTE — Telephone Encounter (Signed)
Result Notes for Basic metabolic panel   Notes recorded by Effie Berkshire, RN on 03/31/2018 at 10:21 AM EDT 2nd attempt to call, no answer, no VM available. ------  Notes recorded by Effie Berkshire, RN on 03/28/2018 at 12:25 PM EDT Attempted to call, no answer. ------  Notes recorded by Shirley Friar, PA-C on 03/27/2018 at 11:36 AM EDT Cr stable. K mildly elevated, only on ramipril. Please encouraged low potassium diet and repeat 2 weeks.     Legrand Como [address]" [place], PA-C 03/27/2018 11:36 AM

## 2018-04-08 ENCOUNTER — Telehealth (HOSPITAL_COMMUNITY): Payer: Self-pay

## 2018-04-08 NOTE — Telephone Encounter (Signed)
Result Notes for Basic metabolic panel   Notes recorded by Effie Berkshire, RN on 04/08/2018 at 11:05 AM EDT 3rd attempt unsuccessful, letter mailed. ------  Notes recorded by Effie Berkshire, RN on 03/31/2018 at 10:21 AM EDT 2nd attempt to call, no answer, no VM available. ------  Notes recorded by Effie Berkshire, RN on 03/28/2018 at 12:25 PM EDT Attempted to call, no answer. ------  Notes recorded by Shirley Friar, PA-C on 03/27/2018 at 11:36 AM EDT Cr stable. K mildly elevated, only on ramipril. Please encouraged low potassium diet and repeat 2 weeks.     Legrand Como [address]" [place], PA-C 03/27/2018 11:36 AM

## 2018-04-17 ENCOUNTER — Telehealth (HOSPITAL_COMMUNITY): Payer: Self-pay | Admitting: *Deleted

## 2018-04-17 NOTE — Telephone Encounter (Signed)
Pt received letter in the mail for lab results. Pt stopped by the clinic today to discuss. (see results below) Pt aware and agreeable with plan. Will have labs drawn at PCP and faxed to our office.   Notes recorded by Effie Berkshire, RN on 04/08/2018 at 11:05 AM EDT 3rd attempt unsuccessful, letter mailed. ------  Notes recorded by Effie Berkshire, RN on 03/31/2018 at 10:21 AM EDT 2nd attempt to call, no answer, no VM available. ------  Notes recorded by Effie Berkshire, RN on 03/28/2018 at 12:25 PM EDT Attempted to call, no answer. ------  Notes recorded by Shirley Friar, PA-C on 03/27/2018 at 11:36 AM EDT Cr stable. K mildly elevated, only on ramipril. Please encouraged low potassium diet and repeat 2 weeks.

## 2018-05-08 ENCOUNTER — Encounter: Payer: Self-pay | Admitting: Cardiovascular Disease

## 2018-05-08 ENCOUNTER — Ambulatory Visit (INDEPENDENT_AMBULATORY_CARE_PROVIDER_SITE_OTHER): Payer: No Typology Code available for payment source | Admitting: Cardiovascular Disease

## 2018-05-08 VITALS — BP 110/62 | HR 73 | Ht 75.0 in | Wt 207.6 lb

## 2018-05-08 DIAGNOSIS — I5042 Chronic combined systolic (congestive) and diastolic (congestive) heart failure: Secondary | ICD-10-CM | POA: Diagnosis not present

## 2018-05-08 DIAGNOSIS — Z95810 Presence of automatic (implantable) cardiac defibrillator: Secondary | ICD-10-CM | POA: Diagnosis not present

## 2018-05-08 DIAGNOSIS — I428 Other cardiomyopathies: Secondary | ICD-10-CM

## 2018-05-08 LAB — CUP PACEART INCLINIC DEVICE CHECK
Date Time Interrogation Session: 20190822143946
MDC IDC LEAD IMPLANT DT: 20190522
MDC IDC LEAD LOCATION: 753860
MDC IDC PG IMPLANT DT: 20190522
MDC IDC SET LEADCHNL RV PACING AMPLITUDE: 3.5 V
MDC IDC SET LEADCHNL RV PACING PULSEWIDTH: 0.4 ms
MDC IDC SET LEADCHNL RV SENSING SENSITIVITY: 0.3 mV

## 2018-05-08 NOTE — Patient Instructions (Signed)
Dr Sallyanne Kuster recommends that you continue on your current medications as directed. Please refer to the Current Medication list given to you today.  Remote monitoring is used to monitor your Pacemaker or ICD from home. This monitoring reduces the number of office visits required to check your device to one time per year. It allows [place] to keep an eye on the functioning of your device to ensure it is working properly. You are scheduled for a device check from home on Thursday, November 21st, 2019. You may send your transmission at any time that day. If you have a wireless device, the transmission will be sent automatically. After your physician reviews your transmission, you will receive a notification with your next transmission date.  To improve our patient care and to more adequately follow your device, [facility] has decided, as a practice, to start following each patient four times a year with your home monitor. This means that you may experience a remote appointment that is close to an in-office appointment with your physician. Your insurance will apply at the same rate as other remote monitoring transmissions.  Dr Sallyanne Kuster recommends that you schedule a follow-up appointment in 12 months with an ICD check. You will receive a reminder letter in the mail two months in advance. If you Obert't receive a letter, please call our office to schedule the follow-up appointment.  If you need a refill on your cardiac medications before your next appointment, please call your pharmacy.
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
## 2018-05-23 ENCOUNTER — Telehealth (HOSPITAL_COMMUNITY): Payer: Self-pay | Admitting: Cardiology

## 2018-05-23 NOTE — Telephone Encounter (Signed)
Abnormal labs results received Labs drawn 05/22/18 K 5.2 Cr 1.68 BUN 30 Na 140  Per Caryl Pina Smith,NP Limit high potassium food, will follow   Patient aware and voiced understanding

## 2018-05-30 ENCOUNTER — Ambulatory Visit (HOSPITAL_COMMUNITY)
Admission: RE | Admit: 2018-05-30 | Discharge: 2018-05-30 | Disposition: A | Discharge status: Home / Self Care | Payer: No Typology Code available for payment source | Source: Ambulatory Visit | Attending: Internal Medicine | Admitting: Internal Medicine

## 2018-05-30 ENCOUNTER — Other Ambulatory Visit (HOSPITAL_COMMUNITY): Payer: Self-pay | Admitting: Internal Medicine

## 2018-05-30 ENCOUNTER — Encounter (HOSPITAL_COMMUNITY): Payer: Self-pay | Admitting: Internal Medicine

## 2018-05-30 ENCOUNTER — Other Ambulatory Visit: Payer: Self-pay

## 2018-05-30 ENCOUNTER — Encounter (HOSPITAL_COMMUNITY): Payer: Self-pay | Admitting: *Deleted

## 2018-05-30 VITALS — BP 125/77 | HR 74 | Wt 210.0 lb

## 2018-05-30 DIAGNOSIS — I5022 Chronic systolic (congestive) heart failure: Secondary | ICD-10-CM | POA: Diagnosis not present

## 2018-05-30 DIAGNOSIS — Z9221 Personal history of antineoplastic chemotherapy: Secondary | ICD-10-CM | POA: Diagnosis not present

## 2018-05-30 DIAGNOSIS — I428 Other cardiomyopathies: Secondary | ICD-10-CM | POA: Diagnosis not present

## 2018-05-30 DIAGNOSIS — Z95810 Presence of automatic (implantable) cardiac defibrillator: Secondary | ICD-10-CM | POA: Diagnosis not present

## 2018-05-30 DIAGNOSIS — I2510 Atherosclerotic heart disease of native coronary artery without angina pectoris: Secondary | ICD-10-CM | POA: Diagnosis not present

## 2018-05-30 DIAGNOSIS — Z8249 Family history of ischemic heart disease and other diseases of the circulatory system: Secondary | ICD-10-CM | POA: Diagnosis not present

## 2018-05-30 DIAGNOSIS — Z880 Allergy status to penicillin: Secondary | ICD-10-CM | POA: Insufficient documentation

## 2018-05-30 DIAGNOSIS — C8590 Non-Hodgkin lymphoma, unspecified, unspecified site: Secondary | ICD-10-CM | POA: Diagnosis not present

## 2018-05-30 DIAGNOSIS — Z79899 Other long term (current) drug therapy: Secondary | ICD-10-CM | POA: Insufficient documentation

## 2018-05-30 DIAGNOSIS — N183 Chronic kidney disease, stage 3 (moderate): Secondary | ICD-10-CM | POA: Insufficient documentation

## 2018-05-30 MED ORDER — LISINOPRIL 10 MG PO TABS: 10.0000 mg | ORAL_TABLET | Freq: Every day | ORAL | 11 refills | Status: DC
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
## 2018-05-30 NOTE — Patient Instructions (Signed)
Increase Lisinopril to 10 mg dailly  Your physician has requested that you have an echocardiogram. Echocardiography is a painless test that uses sound waves to create images of your heart. It provides your doctor with information about the size and shape of your heart and how well your heart's chambers and valves are working. This procedure takes approximately one hour. There are no restrictions for this procedure.  [facility]  Your physician recommends that you schedule a follow-up appointment in: 3 months

## 2018-06-02 ENCOUNTER — Telehealth (HOSPITAL_COMMUNITY): Payer: Self-pay | Admitting: *Deleted

## 2018-06-02 DIAGNOSIS — I5022 Chronic systolic (congestive) heart failure: Secondary | ICD-10-CM

## 2018-06-02 NOTE — Telephone Encounter (Signed)
Pt's sister called and stated they prefer pt to have his echo done here instead of at the [place], pt is in the background and also verbalizes that he would like it done here.  Order placed and echo sch for 10/4.

## 2018-06-20 ENCOUNTER — Ambulatory Visit (HOSPITAL_COMMUNITY): Payer: No Typology Code available for payment source

## 2018-07-09 ENCOUNTER — Other Ambulatory Visit: Payer: Self-pay | Admitting: Student

## 2018-08-07 ENCOUNTER — Telehealth: Payer: Self-pay

## 2018-08-07 NOTE — Telephone Encounter (Signed)
LMOVM reminding pt to send remote transmission.   

## 2018-08-08 ENCOUNTER — Encounter: Payer: Self-pay | Admitting: Cardiology

## 2018-08-19 IMAGING — DX DG CHEST 2V
2 series · 2 of 2 positions shown · non-contrast
Comparison: 07/09/2017

CLINICAL DATA: Left pleural effusion

EXAM:
CHEST  2 VIEW

[chest pa]
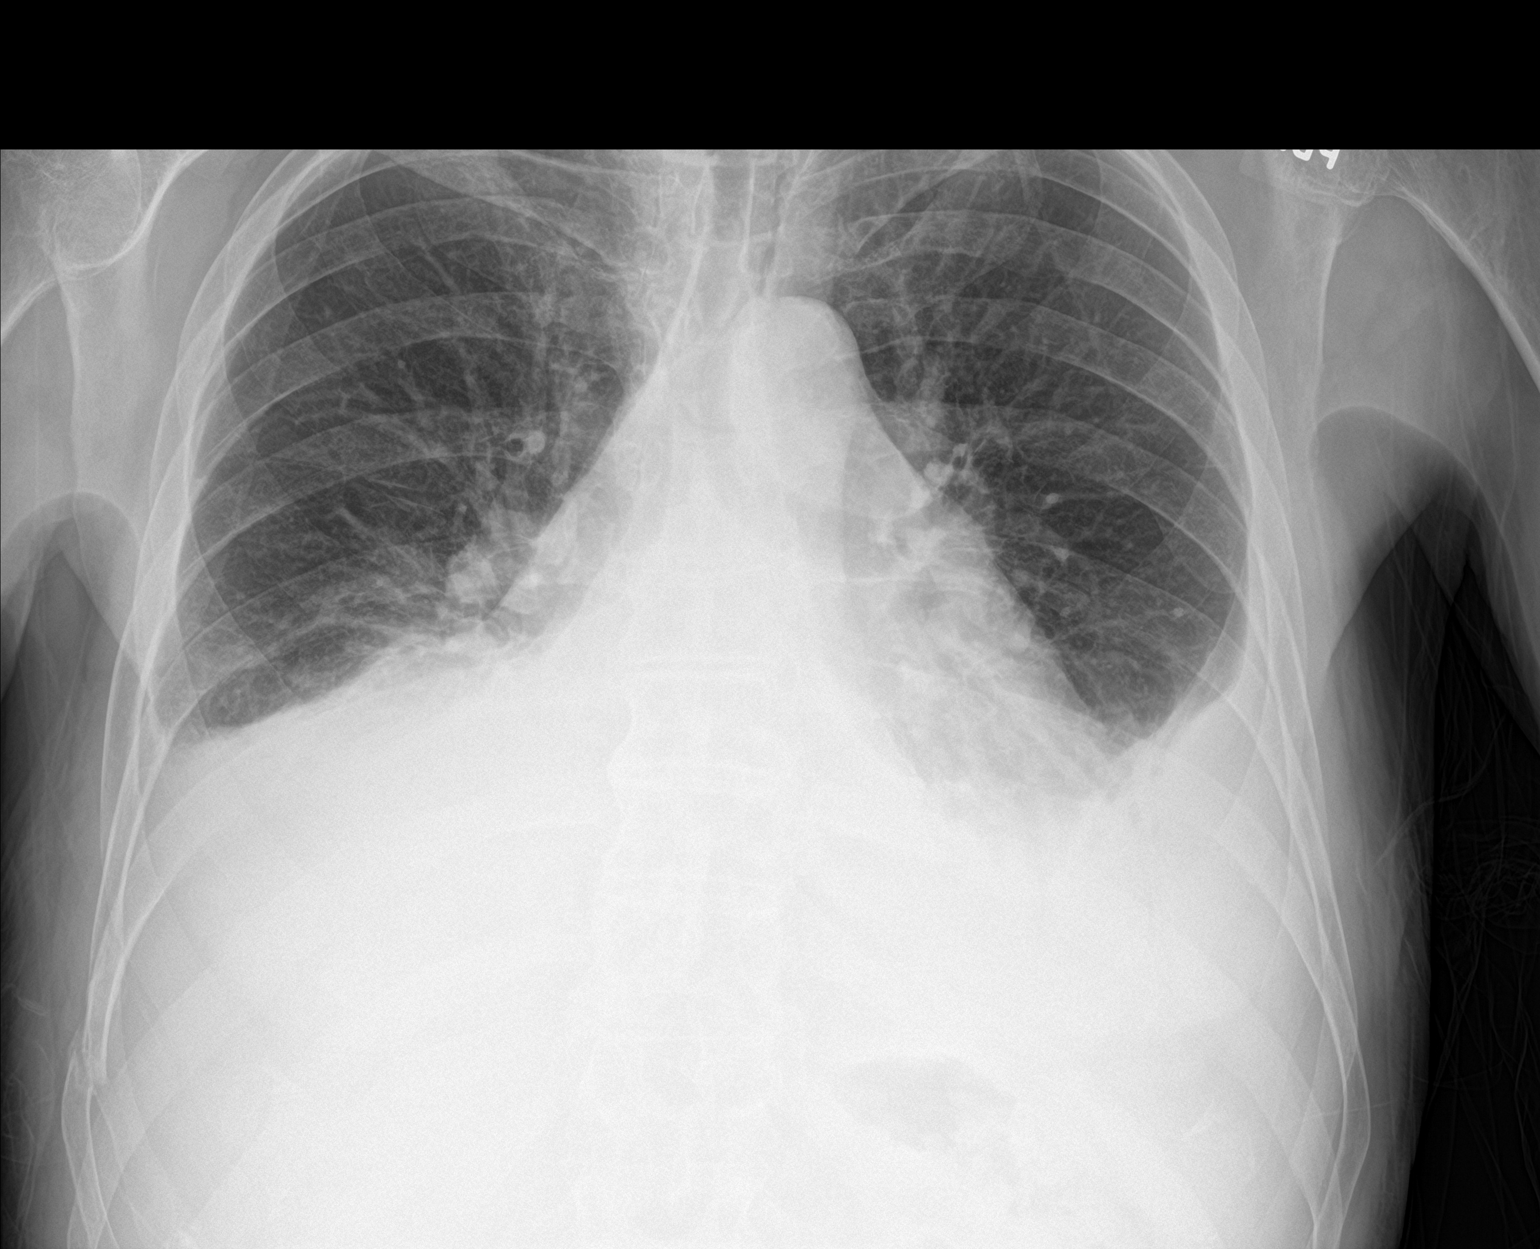

[chest lat]
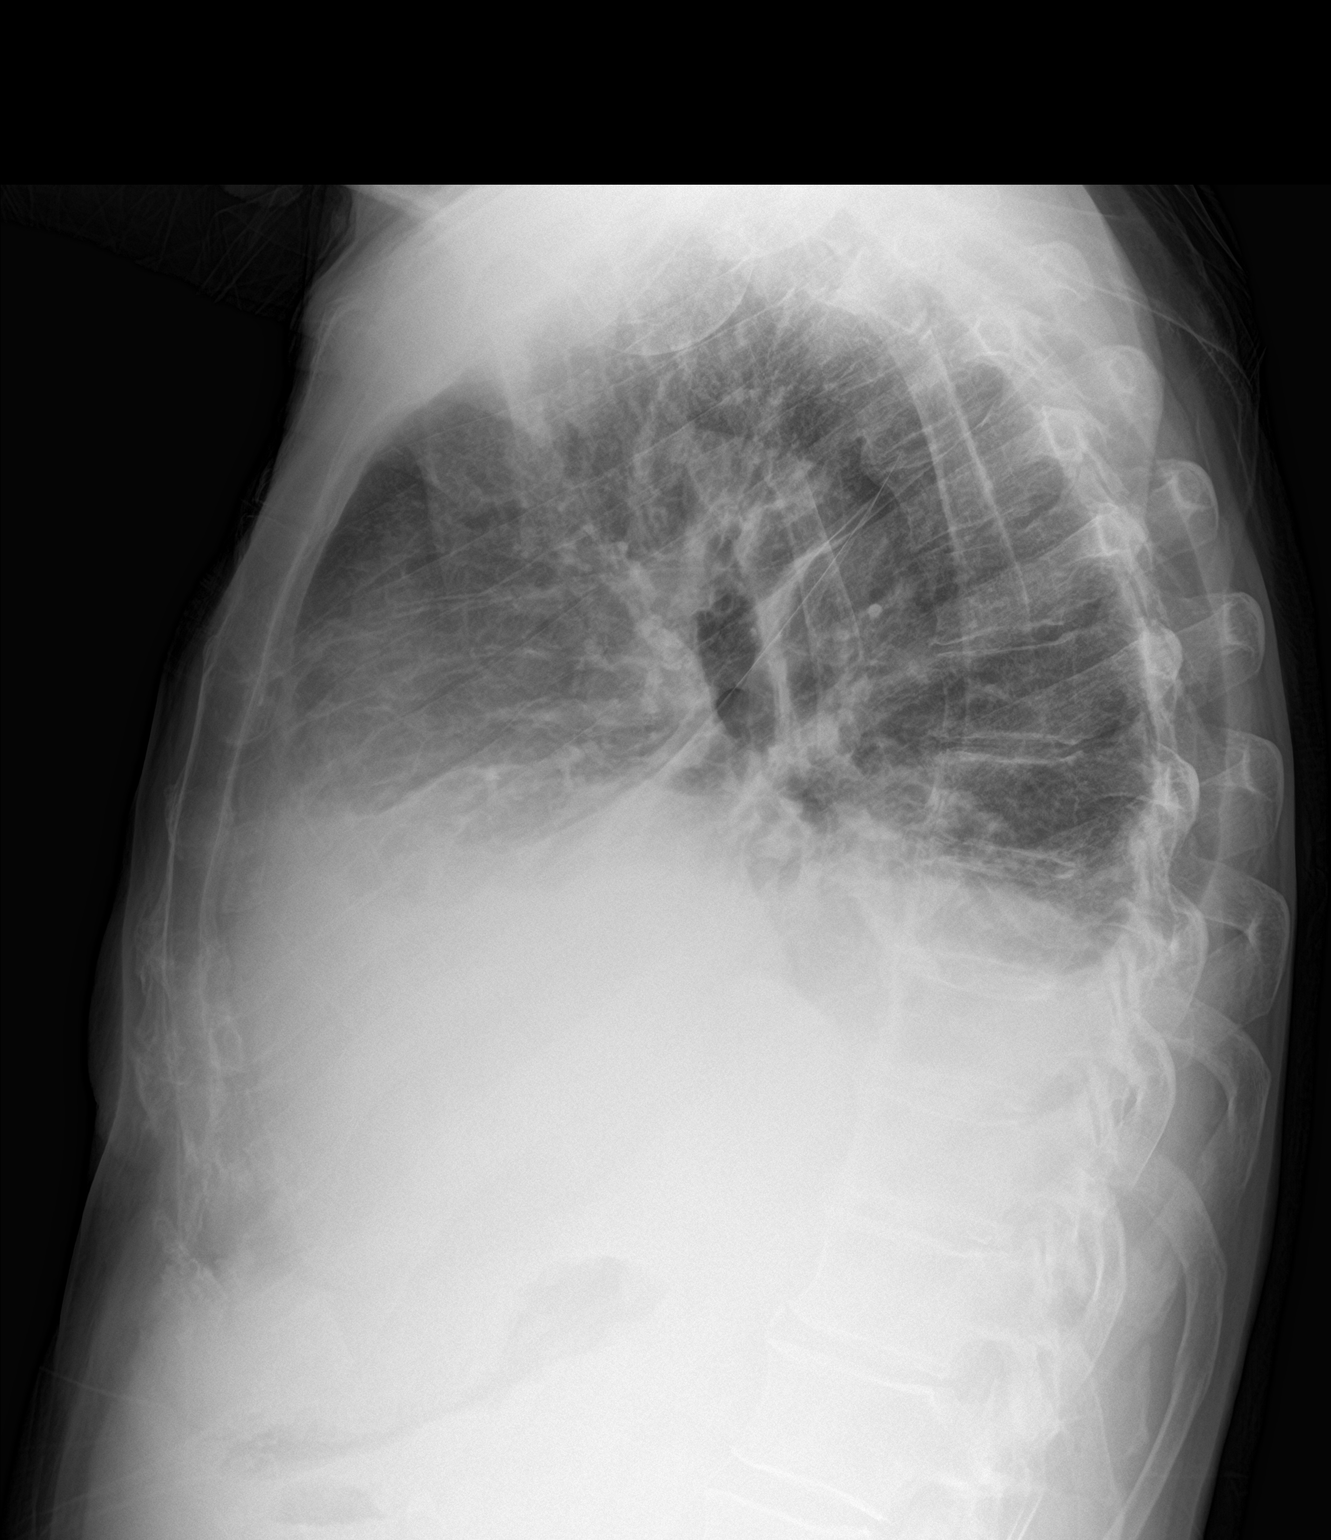

[2 of 2 positions shown; findings below may reference images not displayed]

FINDINGS: The heart remains enlarged. Lungs remain under aerated. Bilateral
pleural effusions are stable. Bibasilar opacity compatible with
atelectasis versus airspace disease is stable. No pneumothorax.
IMPRESSION: Stable. Bilateral pleural effusions and bibasilar pulmonary opacity
as described.

## 2018-08-20 ENCOUNTER — Ambulatory Visit (INDEPENDENT_AMBULATORY_CARE_PROVIDER_SITE_OTHER): Payer: No Typology Code available for payment source

## 2018-08-20 DIAGNOSIS — I428 Other cardiomyopathies: Secondary | ICD-10-CM

## 2018-08-21 NOTE — Progress Notes (Signed)
Remote ICD transmission.   

## 2018-08-26 ENCOUNTER — Encounter: Payer: Self-pay | Admitting: Cardiology

## 2018-09-01 ENCOUNTER — Encounter (HOSPITAL_COMMUNITY): Payer: Self-pay

## 2018-09-01 ENCOUNTER — Ambulatory Visit (HOSPITAL_COMMUNITY)
Admission: RE | Admit: 2018-09-01 | Discharge: 2018-09-01 | Disposition: A | Discharge status: Home / Self Care | Payer: Non-veteran care | Source: Ambulatory Visit | Attending: Internal Medicine | Admitting: Internal Medicine

## 2018-09-01 ENCOUNTER — Encounter (HOSPITAL_COMMUNITY): Payer: Self-pay | Admitting: Internal Medicine

## 2018-09-01 VITALS — BP 138/86 | HR 72 | Wt 219.0 lb

## 2018-09-01 DIAGNOSIS — I428 Other cardiomyopathies: Secondary | ICD-10-CM | POA: Diagnosis not present

## 2018-09-01 DIAGNOSIS — N183 Chronic kidney disease, stage 3 (moderate): Secondary | ICD-10-CM | POA: Diagnosis not present

## 2018-09-01 DIAGNOSIS — Z880 Allergy status to penicillin: Secondary | ICD-10-CM | POA: Insufficient documentation

## 2018-09-01 DIAGNOSIS — C8590 Non-Hodgkin lymphoma, unspecified, unspecified site: Secondary | ICD-10-CM | POA: Diagnosis not present

## 2018-09-01 DIAGNOSIS — I5022 Chronic systolic (congestive) heart failure: Secondary | ICD-10-CM | POA: Diagnosis present

## 2018-09-01 DIAGNOSIS — I2510 Atherosclerotic heart disease of native coronary artery without angina pectoris: Secondary | ICD-10-CM | POA: Insufficient documentation

## 2018-09-01 DIAGNOSIS — Z79899 Other long term (current) drug therapy: Secondary | ICD-10-CM | POA: Diagnosis not present

## 2018-09-01 DIAGNOSIS — Z9221 Personal history of antineoplastic chemotherapy: Secondary | ICD-10-CM | POA: Insufficient documentation

## 2018-09-01 DIAGNOSIS — Z8249 Family history of ischemic heart disease and other diseases of the circulatory system: Secondary | ICD-10-CM | POA: Diagnosis not present

## 2018-09-01 DIAGNOSIS — Z95810 Presence of automatic (implantable) cardiac defibrillator: Secondary | ICD-10-CM | POA: Insufficient documentation
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
## 2018-09-01 NOTE — Addendum Note (Signed)
Encounter addended by: Marlise Eves, RN on: 09/01/2018 12:45 PM  Actions taken: Diagnosis association updated, Order list changed, Clinical Note Signed

## 2018-09-01 NOTE — Addendum Note (Signed)
Encounter addended by: Marlise Eves, RN on: 09/01/2018 12:21 PM  Actions taken: Order list changed

## 2018-09-01 NOTE — Patient Instructions (Addendum)
Your physician has requested that you have an echocardiogram. Echocardiography is a painless test that uses sound waves to create images of your heart. It provides your doctor with information about the size and shape of your heart and how well your heart's chambers and valves are working. This procedure takes approximately one hour. There are no restrictions for this procedure.  Lab work will need to be done. You have been given a prescription for this lab work.  Follow up with Dr. Haroldine Laws in 3 months

## 2018-09-29 ENCOUNTER — Telehealth (HOSPITAL_COMMUNITY): Payer: Self-pay | Admitting: Vascular Surgery

## 2018-09-29 NOTE — Telephone Encounter (Signed)
Left pt sister message, giving echo appt w/ f/u w/ DB 12/11/18 8AM Echo 9:20 AM, [facility]

## 2018-10-08 LAB — CUP PACEART REMOTE DEVICE CHECK
Battery Voltage: 3.07 V
Brady Statistic RV Percent Paced: 0.01 %
Date Time Interrogation Session: 20191204201104
HighPow Impedance: 74 Ohm
Implantable Lead Implant Date: 20190522
Implantable Lead Location: 753860
Lead Channel Pacing Threshold Amplitude: 0.75 V
Lead Channel Sensing Intrinsic Amplitude: 5.75 mV
MDC IDC MSMT BATTERY REMAINING LONGEVITY: 134 mo
MDC IDC MSMT LEADCHNL RV IMPEDANCE VALUE: 399 Ohm
MDC IDC MSMT LEADCHNL RV IMPEDANCE VALUE: 475 Ohm
MDC IDC MSMT LEADCHNL RV PACING THRESHOLD PULSEWIDTH: 0.4 ms
MDC IDC MSMT LEADCHNL RV SENSING INTR AMPL: 5.75 mV
MDC IDC PG IMPLANT DT: 20190522
MDC IDC SET LEADCHNL RV PACING AMPLITUDE: 2.5 V
MDC IDC SET LEADCHNL RV PACING PULSEWIDTH: 0.4 ms
MDC IDC SET LEADCHNL RV SENSING SENSITIVITY: 0.3 mV

## 2018-11-19 ENCOUNTER — Ambulatory Visit (INDEPENDENT_AMBULATORY_CARE_PROVIDER_SITE_OTHER): Payer: No Typology Code available for payment source | Admitting: *Deleted

## 2018-11-19 DIAGNOSIS — Z9189 Other specified personal risk factors, not elsewhere classified: Secondary | ICD-10-CM

## 2018-11-19 DIAGNOSIS — I428 Other cardiomyopathies: Secondary | ICD-10-CM

## 2018-11-19 LAB — CUP PACEART REMOTE DEVICE CHECK
Battery Remaining Longevity: 133 mo
Date Time Interrogation Session: 20200304051803
HighPow Impedance: 72 Ohm
Implantable Lead Implant Date: 20190522
Implantable Lead Location: 753860
Implantable Pulse Generator Implant Date: 20190522
Lead Channel Impedance Value: 418 Ohm
Lead Channel Pacing Threshold Amplitude: 0.875 V
Lead Channel Pacing Threshold Pulse Width: 0.4 ms
Lead Channel Sensing Intrinsic Amplitude: 5.125 mV
Lead Channel Sensing Intrinsic Amplitude: 5.125 mV
Lead Channel Setting Pacing Pulse Width: 0.4 ms
Lead Channel Setting Sensing Sensitivity: 0.3 mV
MDC IDC MSMT BATTERY VOLTAGE: 3.04 V
MDC IDC MSMT LEADCHNL RV IMPEDANCE VALUE: 456 Ohm
MDC IDC SET LEADCHNL RV PACING AMPLITUDE: 2.5 V
MDC IDC STAT BRADY RV PERCENT PACED: 0.02 %

## 2018-11-26 NOTE — Progress Notes (Signed)
Remote ICD transmission.   

## 2018-12-08 ENCOUNTER — Encounter (HOSPITAL_COMMUNITY): Payer: No Typology Code available for payment source | Admitting: Internal Medicine

## 2018-12-11 ENCOUNTER — Encounter (HOSPITAL_COMMUNITY): Payer: No Typology Code available for payment source | Admitting: Internal Medicine

## 2018-12-11 ENCOUNTER — Ambulatory Visit (HOSPITAL_COMMUNITY): Payer: No Typology Code available for payment source

## 2018-12-15 ENCOUNTER — Telehealth: Payer: Self-pay

## 2018-12-15 NOTE — Telephone Encounter (Signed)
Called the patient's home number it belonged to the patient's sister Kennieth Francois who is on the patient's DPR. I asked if the patient if the patient was having new or worsening chest pains, shortness of breath, new or worsening leg swelling, wt gain or increase in abdominal girth. Patient's sister stated no to all the questions. She stated that patient is doing fine. I explained that due to the COVID-19 crisis and him not having any new or worsening cardiac symptoms that he reschedule his appointment to 6 months suggested by his MD. Mrs. Black verbalized an understanding and stated that she or the patient will call back if he needed refills on his medications. I also informed Mrs. Black that if the patient experiences any new and or worsening cardiac symptoms to call 911 and our office.

## 2018-12-16 ENCOUNTER — Ambulatory Visit: Payer: No Typology Code available for payment source | Admitting: Cardiovascular Disease

## 2019-02-04 ENCOUNTER — Other Ambulatory Visit (HOSPITAL_COMMUNITY): Payer: Self-pay | Admitting: Specialist

## 2019-02-04 DIAGNOSIS — G20 Parkinson's disease: Secondary | ICD-10-CM

## 2019-02-04 DIAGNOSIS — R251 Tremor, unspecified: Secondary | ICD-10-CM

## 2019-02-12 ENCOUNTER — Ambulatory Visit (HOSPITAL_COMMUNITY)
Admission: RE | Admit: 2019-02-12 | Discharge: 2019-02-12 | Disposition: A | Discharge status: Home / Self Care | Payer: No Typology Code available for payment source | Source: Ambulatory Visit | Attending: Specialist | Admitting: Specialist

## 2019-02-12 ENCOUNTER — Other Ambulatory Visit: Payer: Self-pay

## 2019-02-12 DIAGNOSIS — R251 Tremor, unspecified: Secondary | ICD-10-CM | POA: Diagnosis present

## 2019-02-12 DIAGNOSIS — G20 Parkinson's disease: Secondary | ICD-10-CM | POA: Insufficient documentation

## 2019-02-12 MED ORDER — IOFLUPANE I 123 185 MBQ/2.5ML IV SOLN
4.4000 | Freq: Once | INTRAVENOUS | Status: AC | PRN
  Administered 2019-02-12: 12:00:00 4.4 via INTRAVENOUS
  Filled 2019-02-12: qty 5

## 2019-02-12 MED ORDER — IODINE STRONG (LUGOLS) 5 % PO SOLN
0.8000 mL | Freq: Once | ORAL | Status: AC
  Administered 2019-02-12: 0.8 mL via ORAL
  Filled 2019-02-12: qty 0.8

## 2019-02-13 ENCOUNTER — Telehealth (HOSPITAL_COMMUNITY): Payer: Self-pay | Admitting: Vascular Surgery

## 2019-02-13 NOTE — Telephone Encounter (Signed)
Left pt message to inform echo/f/u db 6/3 will be canceled due to Covid, pt will be put on waitlist

## 2019-02-18 ENCOUNTER — Ambulatory Visit (INDEPENDENT_AMBULATORY_CARE_PROVIDER_SITE_OTHER): Payer: No Typology Code available for payment source | Admitting: *Deleted

## 2019-02-18 ENCOUNTER — Other Ambulatory Visit (HOSPITAL_COMMUNITY): Payer: No Typology Code available for payment source

## 2019-02-18 ENCOUNTER — Encounter (HOSPITAL_COMMUNITY): Payer: No Typology Code available for payment source | Admitting: Internal Medicine

## 2019-02-18 DIAGNOSIS — I428 Other cardiomyopathies: Secondary | ICD-10-CM | POA: Diagnosis not present

## 2019-02-18 LAB — CUP PACEART REMOTE DEVICE CHECK
Battery Remaining Longevity: 132 mo
Battery Voltage: 3.03 V
Brady Statistic RV Percent Paced: 0.01 %
Date Time Interrogation Session: 20200603062605
HighPow Impedance: 77 Ohm
Implantable Lead Implant Date: 20190522
Implantable Lead Location: 753860
Implantable Pulse Generator Implant Date: 20190522
Lead Channel Impedance Value: 418 Ohm
Lead Channel Impedance Value: 475 Ohm
Lead Channel Pacing Threshold Amplitude: 0.75 V
Lead Channel Pacing Threshold Pulse Width: 0.4 ms
Lead Channel Sensing Intrinsic Amplitude: 5.375 mV
Lead Channel Sensing Intrinsic Amplitude: 5.375 mV
Lead Channel Setting Pacing Amplitude: 2.5 V
Lead Channel Setting Pacing Pulse Width: 0.4 ms
Lead Channel Setting Sensing Sensitivity: 0.3 mV

## 2019-02-26 ENCOUNTER — Encounter: Payer: Self-pay | Admitting: Cardiology

## 2019-02-26 NOTE — Progress Notes (Signed)
Remote ICD transmission.   

## 2019-03-06 IMAGING — DX DG CHEST 2V
2 series · 2 of 2 positions shown · non-contrast
Comparison: July 22, 2017

CLINICAL DATA: Cardiac arrhythmia with pacemaker placement

EXAM:
CHEST - 2 VIEW

[chest pa]
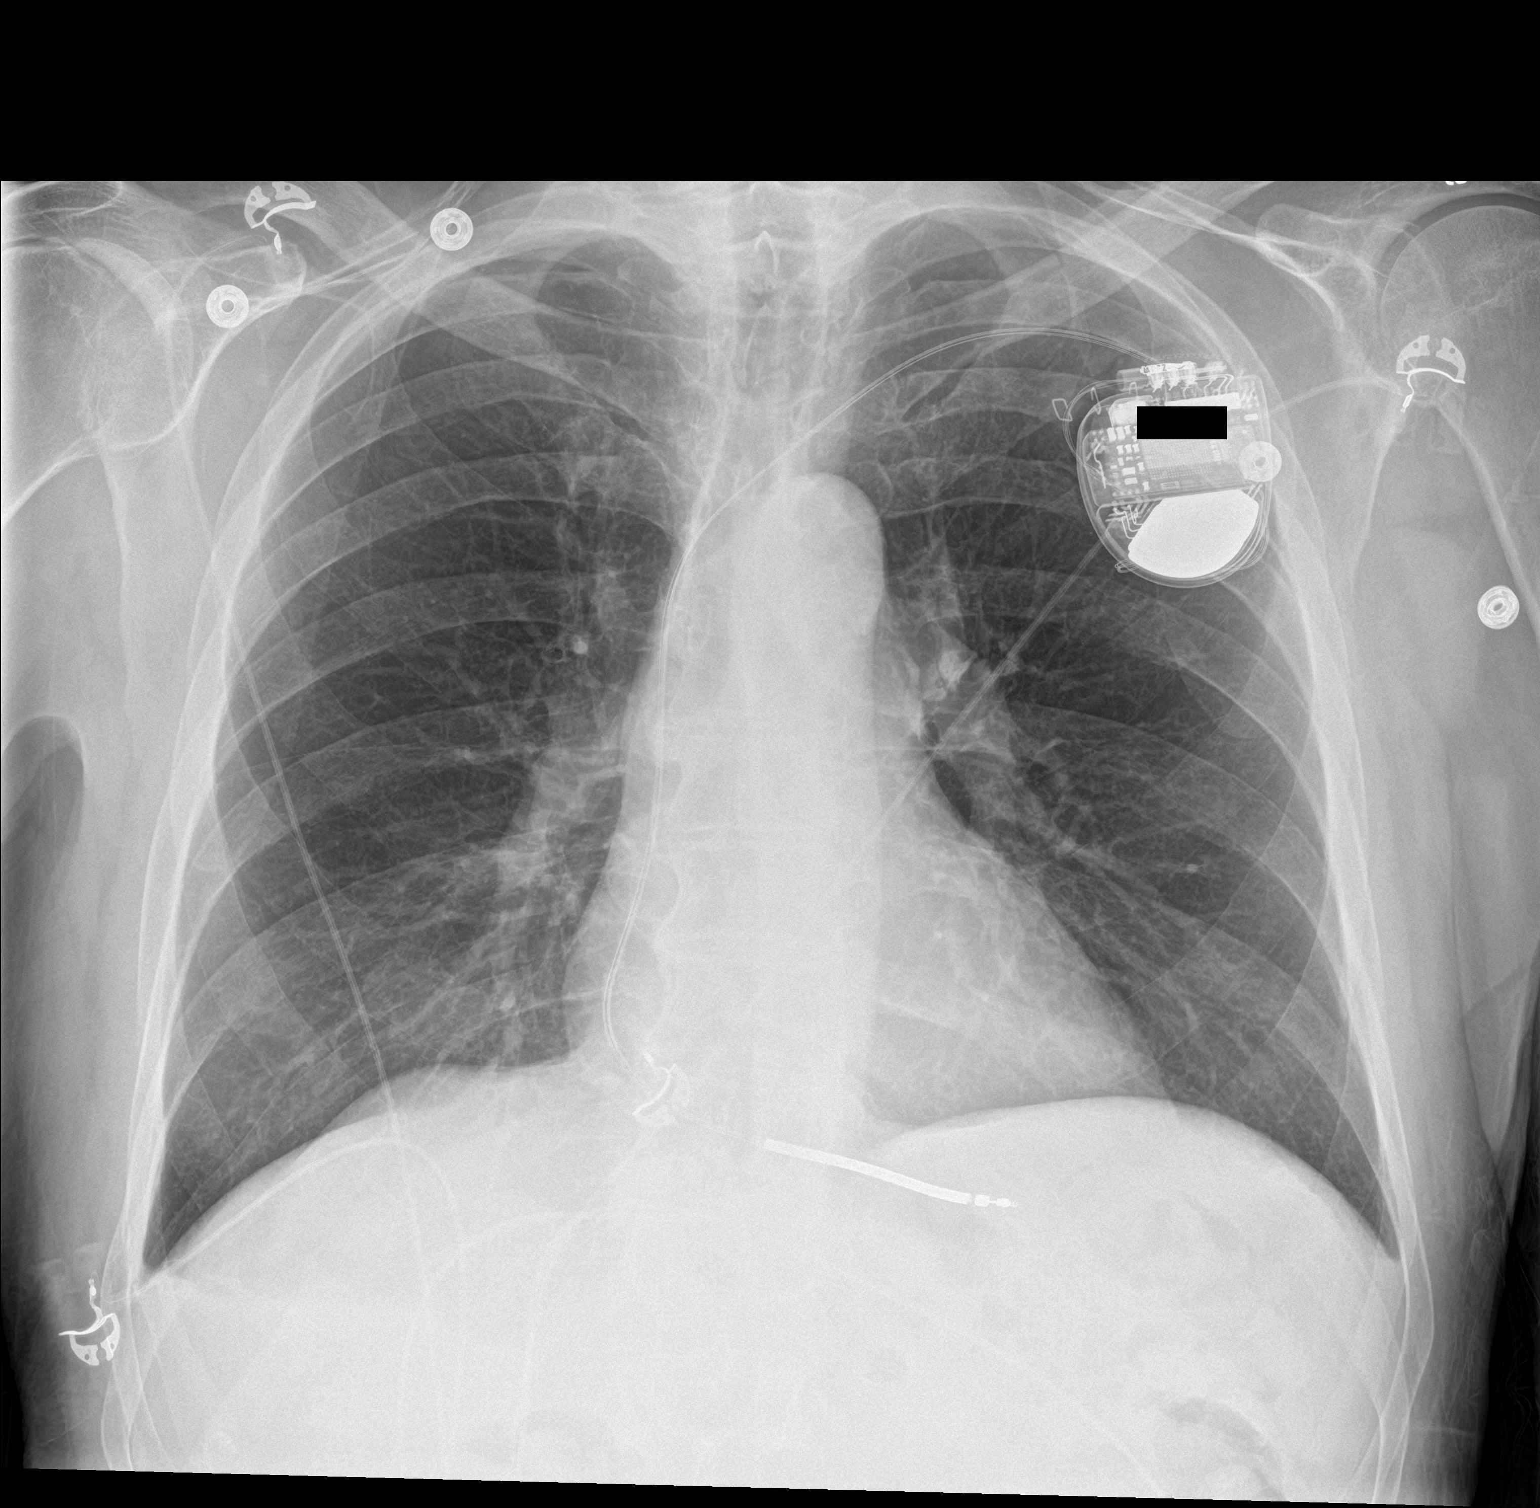

[chest lat]
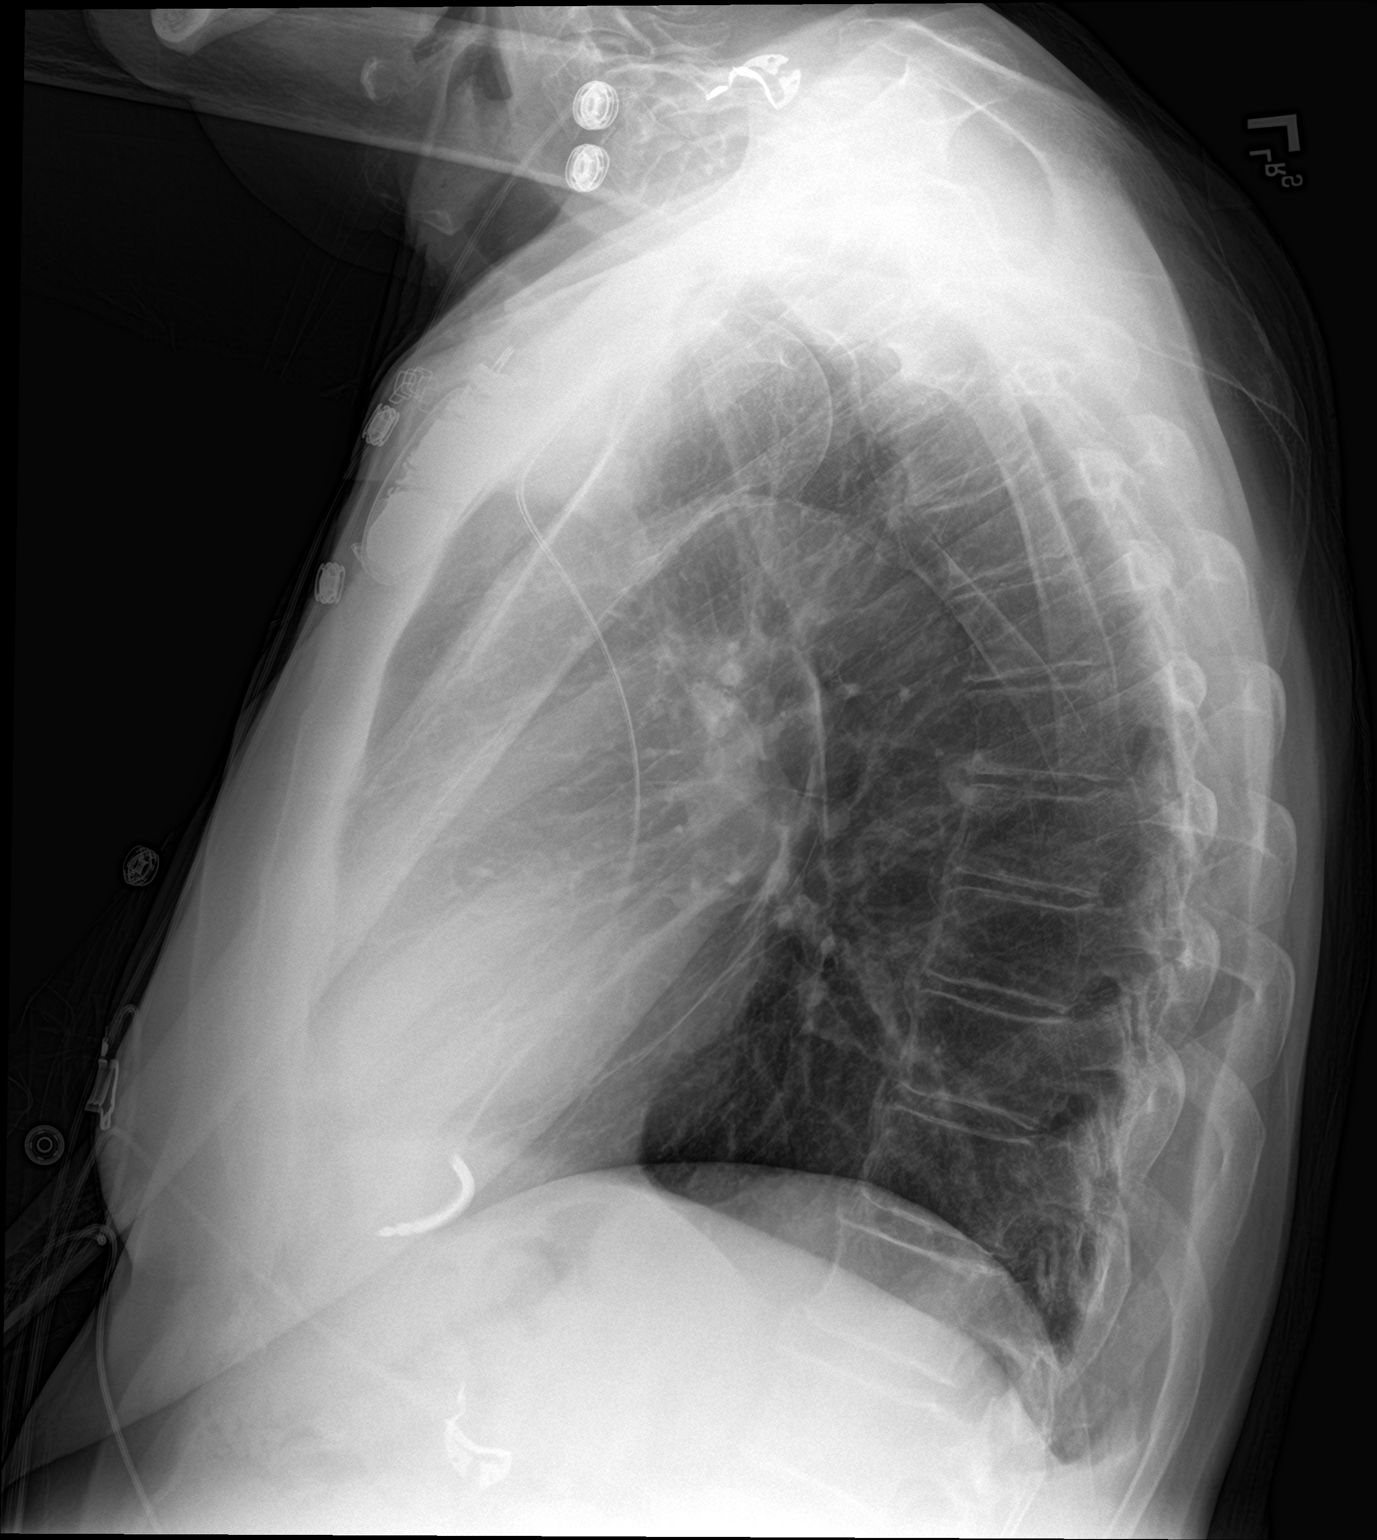

[2 of 2 positions shown; findings below may reference images not displayed]

FINDINGS: There is a pacemaker on the left with lead tip attached to right
ventricle. No pneumothorax. There is no edema or consolidation.
Heart size and pulmonary vascularity are normal. No adenopathy.
There is slight degenerative change in the thoracic spine.
IMPRESSION: Pacemaker lead tip attached to right ventricle. Lungs clear. Heart
size normal.

## 2019-05-20 ENCOUNTER — Encounter: Payer: No Typology Code available for payment source | Admitting: *Deleted

## 2019-07-01 ENCOUNTER — Telehealth: Payer: Self-pay | Admitting: *Deleted

## 2019-07-01 NOTE — Telephone Encounter (Signed)
A message was left, re: his follow up visit. 

## 2019-07-17 ENCOUNTER — Telehealth: Payer: Self-pay | Admitting: Cardiovascular Disease

## 2019-07-17 NOTE — Telephone Encounter (Signed)
This patient walked in and wanted to inform staff to stop calling him.Marland KitchenHe said to inform Dr. Loletha Grayer and Dr. Gwenlyn Found that he is no longer under their care and that all of medical care has been transferred over to the [facility]Marland KitchenMarland Kitchen

## 2019-07-17 NOTE — Telephone Encounter (Signed)
This encounter was created in error - please disregard.

## 2022-12-03 ENCOUNTER — Encounter: Payer: Self-pay | Admitting: Neurology

## 2022-12-03 ENCOUNTER — Ambulatory Visit (INDEPENDENT_AMBULATORY_CARE_PROVIDER_SITE_OTHER): Payer: No Typology Code available for payment source | Admitting: Neurology

## 2022-12-03 VITALS — BP 134/87 | HR 64 | Ht 74.6 in | Wt 215.0 lb

## 2022-12-03 DIAGNOSIS — G250 Essential tremor: Secondary | ICD-10-CM

## 2022-12-03 DIAGNOSIS — T7840XA Allergy, unspecified, initial encounter: Secondary | ICD-10-CM | POA: Insufficient documentation

## 2022-12-03 DIAGNOSIS — J45909 Unspecified asthma, uncomplicated: Secondary | ICD-10-CM | POA: Insufficient documentation

## 2022-12-03 DIAGNOSIS — J309 Allergic rhinitis, unspecified: Secondary | ICD-10-CM | POA: Insufficient documentation

## 2022-12-03 DIAGNOSIS — I10 Essential (primary) hypertension: Secondary | ICD-10-CM | POA: Insufficient documentation

## 2022-12-03 DIAGNOSIS — R7881 Bacteremia: Secondary | ICD-10-CM | POA: Insufficient documentation

## 2022-12-03 DIAGNOSIS — N400 Enlarged prostate without lower urinary tract symptoms: Secondary | ICD-10-CM | POA: Insufficient documentation

## 2022-12-03 DIAGNOSIS — N281 Cyst of kidney, acquired: Secondary | ICD-10-CM | POA: Insufficient documentation

## 2022-12-03 DIAGNOSIS — H2513 Age-related nuclear cataract, bilateral: Secondary | ICD-10-CM | POA: Insufficient documentation

## 2022-12-03 DIAGNOSIS — H9190 Unspecified hearing loss, unspecified ear: Secondary | ICD-10-CM | POA: Insufficient documentation

## 2022-12-03 MED ORDER — PROPRANOLOL HCL 20 MG PO TABS: ORAL_TABLET | ORAL | 6 refills | Status: AC
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
## 2022-12-04 ENCOUNTER — Telehealth: Payer: Self-pay | Admitting: Neurology

## 2022-12-04 NOTE — Telephone Encounter (Signed)
Aloha Gell is calling from [place]. Stated she needs to talk to nurse to clarify a  propranolol (INDERAL) 20 MG tablet

## 2022-12-05 NOTE — Telephone Encounter (Signed)
Inderal 20 mg twice daily as needed for tremor

## 2022-12-05 NOTE — Telephone Encounter (Signed)
[facility] and [facility] [place] and clarified [facility]

## 2023-08-02 ENCOUNTER — Other Ambulatory Visit (HOSPITAL_COMMUNITY): Payer: Self-pay | Admitting: Physician Assistant

## 2023-08-02 DIAGNOSIS — C8590 Non-Hodgkin lymphoma, unspecified, unspecified site: Secondary | ICD-10-CM

## 2023-08-02 DIAGNOSIS — D696 Thrombocytopenia, unspecified: Secondary | ICD-10-CM

## 2023-08-26 ENCOUNTER — Ambulatory Visit (HOSPITAL_COMMUNITY): Payer: No Typology Code available for payment source

## 2023-08-27 ENCOUNTER — Other Ambulatory Visit: Payer: Self-pay | Admitting: Urology

## 2023-08-27 DIAGNOSIS — Z01818 Encounter for other preprocedural examination: Secondary | ICD-10-CM

## 2023-08-29 ENCOUNTER — Ambulatory Visit (HOSPITAL_COMMUNITY): Payer: No Typology Code available for payment source

## 2023-09-03 ENCOUNTER — Other Ambulatory Visit: Payer: Self-pay | Admitting: Radiology

## 2023-09-03 DIAGNOSIS — D696 Thrombocytopenia, unspecified: Secondary | ICD-10-CM
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
## 2023-09-05 ENCOUNTER — Other Ambulatory Visit: Payer: Self-pay

## 2023-09-05 ENCOUNTER — Ambulatory Visit (HOSPITAL_COMMUNITY)
Admission: RE | Admit: 2023-09-05 | Discharge: 2023-09-05 | Disposition: A | Discharge status: Home / Self Care | Payer: No Typology Code available for payment source | Source: Ambulatory Visit | Attending: Physician Assistant | Admitting: Physician Assistant

## 2023-09-05 ENCOUNTER — Encounter (HOSPITAL_COMMUNITY): Payer: Self-pay

## 2023-09-05 DIAGNOSIS — Z8572 Personal history of non-Hodgkin lymphomas: Secondary | ICD-10-CM | POA: Diagnosis present

## 2023-09-05 DIAGNOSIS — D696 Thrombocytopenia, unspecified: Secondary | ICD-10-CM | POA: Diagnosis not present

## 2023-09-05 DIAGNOSIS — C8590 Non-Hodgkin lymphoma, unspecified, unspecified site: Secondary | ICD-10-CM

## 2023-09-05 HISTORY — DX: Prediabetes: R73.03

## 2023-09-05 LAB — CBC WITH DIFFERENTIAL/PLATELET
Abs Immature Granulocytes: 0.04 10*3/uL (ref 0.00–0.07)
Basophils Absolute: 0 10*3/uL (ref 0.0–0.1)
Basophils Relative: 1 %
Eosinophils Absolute: 0.2 10*3/uL (ref 0.0–0.5)
Eosinophils Relative: 5 %
HCT: 42.1 % (ref 39.0–52.0)
Hemoglobin: 13.3 g/dL (ref 13.0–17.0)
Immature Granulocytes: 1 %
Lymphocytes Relative: 40 %
Lymphs Abs: 1.7 10*3/uL (ref 0.7–4.0)
MCH: 27.5 pg (ref 26.0–34.0)
MCHC: 31.6 g/dL (ref 30.0–36.0)
MCV: 87.2 fL (ref 80.0–100.0)
Monocytes Absolute: 0.6 10*3/uL (ref 0.1–1.0)
Monocytes Relative: 14 %
Neutro Abs: 1.6 10*3/uL — ABNORMAL LOW (ref 1.7–7.7)
Neutrophils Relative %: 39 %
Platelets: 90 10*3/uL — ABNORMAL LOW (ref 150–400)
RBC: 4.83 MIL/uL (ref 4.22–5.81)
RDW: 13.6 % (ref 11.5–15.5)
WBC: 4.2 10*3/uL (ref 4.0–10.5)
nRBC: 0 % (ref 0.0–0.2)

## 2023-09-05 MED ORDER — FENTANYL CITRATE (PF) 100 MCG/2ML IJ SOLN
INTRAMUSCULAR | Status: AC
  Filled 2023-09-05: qty 2

## 2023-09-05 MED ORDER — MIDAZOLAM HCL 2 MG/2ML IJ SOLN
INTRAMUSCULAR | Status: AC
  Filled 2023-09-05: qty 4

## 2023-09-05 MED ORDER — SODIUM CHLORIDE 0.9 % IV SOLN: INTRAVENOUS | Status: DC

## 2023-09-05 MED ORDER — FENTANYL CITRATE (PF) 100 MCG/2ML IJ SOLN
INTRAMUSCULAR | Status: AC | PRN
  Administered 2023-09-05 (×2): 50 ug via INTRAVENOUS

## 2023-09-05 MED ORDER — MIDAZOLAM HCL 2 MG/2ML IJ SOLN
INTRAMUSCULAR | Status: AC | PRN
  Administered 2023-09-05 (×2): 1 mg via INTRAVENOUS

## 2023-09-05 NOTE — Discharge Instructions (Signed)

## 2023-09-05 NOTE — Procedures (Signed)
Pre-procedure Diagnosis: History of non-Hodgkin's lymphoma, now with persistent thrombocytopenia and concern for possible recurrent lymphoma.  Post-procedure Diagnosis: Same  Technically successful CT guided bone marrow aspiration and biopsy of left iliac crest.   Complications: None Immediate  EBL: None  Signed: Simonne Come Pager: [phone] 09/05/2023, 9:44 AM

## 2023-09-10 LAB — SURGICAL PATHOLOGY

## 2023-09-17 ENCOUNTER — Encounter (HOSPITAL_COMMUNITY): Payer: Self-pay

## 2024-01-09 ENCOUNTER — Emergency Department (HOSPITAL_COMMUNITY)
Admission: EM | Admit: 2024-01-09 | Discharge: 2024-01-09 | Disposition: A | Discharge status: Home / Self Care | Attending: Emergency Medicine | Admitting: Emergency Medicine

## 2024-01-09 ENCOUNTER — Encounter (HOSPITAL_COMMUNITY): Payer: Self-pay

## 2024-01-09 ENCOUNTER — Other Ambulatory Visit: Payer: Self-pay

## 2024-01-09 DIAGNOSIS — Z7901 Long term (current) use of anticoagulants: Secondary | ICD-10-CM | POA: Diagnosis not present

## 2024-01-09 DIAGNOSIS — Y9301 Activity, walking, marching and hiking: Secondary | ICD-10-CM | POA: Diagnosis not present

## 2024-01-09 DIAGNOSIS — S42291A Other displaced fracture of upper end of right humerus, initial encounter for closed fracture: Secondary | ICD-10-CM | POA: Diagnosis not present

## 2024-01-09 DIAGNOSIS — I509 Heart failure, unspecified: Secondary | ICD-10-CM | POA: Insufficient documentation

## 2024-01-09 DIAGNOSIS — Y92512 Supermarket, store or market as the place of occurrence of the external cause: Secondary | ICD-10-CM | POA: Insufficient documentation

## 2024-01-09 DIAGNOSIS — Z95810 Presence of automatic (implantable) cardiac defibrillator: Secondary | ICD-10-CM | POA: Insufficient documentation

## 2024-01-09 DIAGNOSIS — W01198A Fall on same level from slipping, tripping and stumbling with subsequent striking against other object, initial encounter: Secondary | ICD-10-CM | POA: Diagnosis not present

## 2024-01-09 DIAGNOSIS — S4991XA Unspecified injury of right shoulder and upper arm, initial encounter: Secondary | ICD-10-CM | POA: Diagnosis present

## 2024-01-09 MED ORDER — OXYCODONE-ACETAMINOPHEN 5-325 MG PO TABS
2.0000 | ORAL_TABLET | Freq: Once | ORAL | Status: AC
  Administered 2024-01-09: 2 via ORAL
  Filled 2024-01-09: qty 2

## 2024-01-09 MED ORDER — OXYCODONE-ACETAMINOPHEN 5-325 MG PO TABS: 1.0000 | ORAL_TABLET | Freq: Four times a day (QID) | ORAL | 0 refills | Status: DC | PRN

## 2024-01-09 NOTE — Discharge Instructions (Signed)
 You have injured your right shoulder.  You will need to follow-up closely with orthopedic specialist for further evaluation and management.  Please take pain medication as prescribed but be aware it can cause drowsiness and increased risk of falling so take it with caution.  Return if you have any concern.

## 2024-01-09 NOTE — ED Triage Notes (Signed)
 Reports he was in a super market and tripped over a pallet landing on right shoulder.  Patient has extensive swelling and bruising.  Went to [place] and was sent here due to having a fracture and needing possible surgery

## 2024-01-09 NOTE — ED Provider Notes (Signed)
 [place] EMERGENCY DEPARTMENT AT [facility] Provider Note   CSN: [id number] Arrival date & time: 01/09/24  1656     History  Chief Complaint  Patient presents with   Shoulder Injury    Cesar Campbell is a 78 y.o. male.  The history is provided by the patient, medical records and the spouse. No language interpreter was used.  Shoulder Injury     66 male history of CHF, has AICD, prediabetes, non-Hodgkin lymphoma presenting for evaluation of a shoulder injury.  Patient report yesterday as he was walking around the supermarket he tripped over a pallet and fell landing on his right shoulder.  He endorsed acute onset of right shoulder pain.  He was seen at  [facility] today for his evaluation.  He had an x-ray of his shoulder and was told that it is broken.  He was instructed to follow-up with an orthopedic specialist.  He was provided a sling.  Patient is here requesting for pain medication and states he was not given any adequate pain management and he would like to have his shoulder evaluated and possible surgery.  Patient on Eliquis, blood thinner medication.  He states he has been taking Tylenol  without relief.  Denies any other injury.  Home Medications Prior to Admission medications   Medication Sig Start Date End Date Taking? Authorizing Provider  albuterol  (VENTOLIN  HFA) 108 (90 Base) MCG/ACT inhaler Inhale 2 puffs into the lungs every 6 (six) hours as needed. 08/01/22   [provider]  apixaban (ELIQUIS) 5 MG TABS tablet Take 5 mg by mouth 2 (two) times daily. 09/28/21   [provider]  atorvastatin (LIPITOR) 40 MG tablet Take 40 mg by mouth daily. 09/13/22   [provider]  finasteride  (PROSCAR ) 5 MG tablet Take 5 mg by mouth daily.    [provider]  latanoprost (XALATAN) 0.005 % ophthalmic solution Place 1 drop into both eyes at bedtime.    [provider]  metFORMIN (GLUCOPHAGE) 500 MG tablet Take 500 mg by mouth in  the morning and at bedtime. 08/27/22   [provider]  omeprazole (PRILOSEC) 40 MG capsule Take 40 mg by mouth daily.    [provider]  primidone (MYSOLINE) 50 MG tablet Take 150 mg by mouth in the morning and at bedtime.    [provider]  propranolol  (INDERAL ) 20 MG tablet As needed 30 minutes before event Patient taking differently: Take 20 mg by mouth in the morning and at bedtime. As needed 30 minutes before event - tremors 12/03/22   Phebe Brasil, MD  tamsulosin  (FLOMAX ) 0.4 MG CAPS capsule Take 0.4 mg by mouth daily.    [provider]      Allergies    Penicillins    Review of Systems   Review of Systems  All other systems reviewed and are negative.   Physical Exam Updated Vital Signs BP 116/81 (BP Location: Left Arm)   Pulse 96   Temp 98.4 F (36.9 C) (Oral)   Resp 18   Ht 6' 2.5" (1.892 m)   Wt 100.2 kg   SpO2 100%   BMI 28.00 kg/m  Physical Exam Constitutional:      General: He is not in acute distress.    Appearance: He is well-developed.  HENT:     Head: Atraumatic.  Eyes:     Conjunctiva/sclera: Conjunctivae normal.  Musculoskeletal:        General: Tenderness (Right arm: Tenderness throughout upper arm  with ecchymosis noted to right bicep region and right humeral region with decreased range of motion.  Sling in place.  Sensation intact throughout.) present.     Cervical back: Normal range of motion and neck supple.  Skin:    Findings: No rash.  Neurological:     Mental Status: He is alert.     ED Results / Procedures / Treatments   Labs (all labs ordered are listed, but only abnormal results are displayed) Labs Reviewed - No data to display  EKG None  Radiology No results found.  Procedures Procedures    Medications Ordered in ED Medications  oxyCODONE -acetaminophen  (PERCOCET/ROXICET) 5-325 MG per tablet 2 tablet (has no administration in time range)    ED Course/ Medical Decision Making/ A&P                                  Medical Decision Making Risk Prescription drug management.   BP 116/81 (BP Location: Left Arm)   Pulse 96   Temp 98.4 F (36.9 C) (Oral)   Resp 18   Ht 6' 2.5" (1.892 m)   Wt 100.2 kg   SpO2 100%   BMI 28.00 kg/m   5:33 PM  53 male history of CHF, has AICD, prediabetes, non-Hodgkin lymphoma presenting for evaluation of a shoulder injury.  Patient report yesterday as he was walking around the supermarket he tripped over a pallet and fell landing on his right shoulder.  He endorsed acute onset of right shoulder pain.  He was seen at  [facility] today for his evaluation.  He had an x-ray of his shoulder and was told that it is broken.  He was instructed to follow-up with an orthopedic specialist.  He was provided a sling.  Patient is here requesting for pain medication and states he was not given any adequate pain management and he would like to have his shoulder evaluated and possible surgery.  Patient is not on any blood thinner medication.  He states he has been taking Tylenol  without relief.  Denies any other injury.  Exam notable for tenderness to about the right upper shoulder and upper arm with adjacent ecchymosis.  Sling is in place.  Patient is neurovascular intact.  EMR reviewed patient was seen at the [facility] earlier today for his complaint.  An x-ray of the right shoulder was obtained and the impression shows acute comminuted fracture involving the humeral head with fracture lines extending through the greater tuberosity and surgical neck.  This is a closed injury.  Patient is neurovascular intact.  Patient has a sling available.  Patient was given Percocet for pain management.  Will give patient close follow-up with orthopedist for outpatient care.  I do not appreciate any other signs of injury on today's exam.  I gave patient return precaution.  I do not think repeat x-ray is indicated at this time.        Final Clinical Impression(s) / ED  Diagnoses Final diagnoses:  Fracture of anatomical neck of right humerus, closed, initial encounter    Rx / DC Orders ED Discharge Orders          Ordered    oxyCODONE -acetaminophen  (PERCOCET) 5-325 MG tablet  Every 6 hours PRN        01/09/24 1740              Debbra Fairy, PA-C 01/09/24 1741    Butler, Michael C,  MD 01/10/24 1047

## 2024-01-10 ENCOUNTER — Other Ambulatory Visit (INDEPENDENT_AMBULATORY_CARE_PROVIDER_SITE_OTHER): Payer: Self-pay

## 2024-01-10 ENCOUNTER — Encounter: Payer: Self-pay | Admitting: Surgical

## 2024-01-10 ENCOUNTER — Telehealth: Payer: Self-pay | Admitting: Radiology

## 2024-01-10 ENCOUNTER — Ambulatory Visit (INDEPENDENT_AMBULATORY_CARE_PROVIDER_SITE_OTHER): Admitting: Surgical

## 2024-01-10 DIAGNOSIS — S42294A Other nondisplaced fracture of upper end of right humerus, initial encounter for closed fracture: Secondary | ICD-10-CM | POA: Diagnosis not present

## 2024-01-10 DIAGNOSIS — M25511 Pain in right shoulder: Secondary | ICD-10-CM

## 2024-01-10 MED ORDER — OXYCODONE-ACETAMINOPHEN 5-325 MG PO TABS: 1.0000 | ORAL_TABLET | Freq: Four times a day (QID) | ORAL | 0 refills | Status: DC | PRN

## 2024-01-10 NOTE — Telephone Encounter (Signed)
 Will need to call Mr. baylin gamblin, Danniel Duverney, to schedule follow up appt with Dr. Rozelle Corning at [phone]
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
## 2024-01-10 NOTE — Telephone Encounter (Signed)
 Call patient to schedule follow up with Dr. Rozelle Corning post CT scan to discuss results and next steps.

## 2024-01-12 ENCOUNTER — Encounter: Payer: Self-pay | Admitting: Surgical

## 2024-01-13 ENCOUNTER — Ambulatory Visit: Admitting: Surgical

## 2024-01-14 ENCOUNTER — Ambulatory Visit
Admission: RE | Admit: 2024-01-14 | Discharge: 2024-01-14 | Disposition: A | Discharge status: Home / Self Care | Source: Ambulatory Visit | Attending: Surgical | Admitting: Surgical

## 2024-01-14 DIAGNOSIS — M25511 Pain in right shoulder: Secondary | ICD-10-CM

## 2024-01-14 NOTE — Telephone Encounter (Signed)
 Appt scheduled

## 2024-01-15 ENCOUNTER — Encounter: Payer: Self-pay | Admitting: Orthopedic Surgery

## 2024-01-15 ENCOUNTER — Ambulatory Visit (INDEPENDENT_AMBULATORY_CARE_PROVIDER_SITE_OTHER): Admitting: Orthopedic Surgery

## 2024-01-15 DIAGNOSIS — S42291D Other displaced fracture of upper end of right humerus, subsequent encounter for fracture with routine healing: Secondary | ICD-10-CM
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
## 2024-01-24 ENCOUNTER — Other Ambulatory Visit (INDEPENDENT_AMBULATORY_CARE_PROVIDER_SITE_OTHER)

## 2024-01-24 ENCOUNTER — Ambulatory Visit (INDEPENDENT_AMBULATORY_CARE_PROVIDER_SITE_OTHER): Admitting: Surgical

## 2024-01-24 DIAGNOSIS — S42291D Other displaced fracture of upper end of right humerus, subsequent encounter for fracture with routine healing: Secondary | ICD-10-CM | POA: Diagnosis not present

## 2024-01-24 MED ORDER — OXYCODONE-ACETAMINOPHEN 5-325 MG PO TABS: 1.0000 | ORAL_TABLET | Freq: Three times a day (TID) | ORAL | 0 refills | Status: DC | PRN

## 2024-01-26 ENCOUNTER — Encounter: Payer: Self-pay | Admitting: Surgical
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
## 2024-02-03 ENCOUNTER — Ambulatory Visit: Admitting: Surgical

## 2024-02-03 ENCOUNTER — Other Ambulatory Visit (INDEPENDENT_AMBULATORY_CARE_PROVIDER_SITE_OTHER): Payer: Self-pay

## 2024-02-03 DIAGNOSIS — S42291D Other displaced fracture of upper end of right humerus, subsequent encounter for fracture with routine healing: Secondary | ICD-10-CM | POA: Diagnosis not present

## 2024-02-04 ENCOUNTER — Other Ambulatory Visit (HOSPITAL_COMMUNITY): Payer: Self-pay | Admitting: Neurology

## 2024-02-04 DIAGNOSIS — R251 Tremor, unspecified: Secondary | ICD-10-CM

## 2024-02-05 ENCOUNTER — Telehealth: Payer: Self-pay

## 2024-02-05 ENCOUNTER — Encounter: Payer: Self-pay | Admitting: Surgical
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
## 2024-02-06 ENCOUNTER — Telehealth: Payer: Self-pay | Admitting: Orthopedic Surgery

## 2024-02-06 NOTE — Telephone Encounter (Signed)
 Patient called and needs a refill on Oxycodone . [phone]

## 2024-02-07 ENCOUNTER — Other Ambulatory Visit: Payer: Self-pay | Admitting: Orthopaedic Surgery

## 2024-02-07 ENCOUNTER — Telehealth: Payer: Self-pay | Admitting: Orthopedic Surgery

## 2024-02-07 MED ORDER — OXYCODONE-ACETAMINOPHEN 5-325 MG PO TABS: 1.0000 | ORAL_TABLET | Freq: Three times a day (TID) | ORAL | 0 refills | Status: DC | PRN

## 2024-02-07 NOTE — Telephone Encounter (Signed)
 Patient called and needs a refill on Oxycodone . [phone]

## 2024-02-13 ENCOUNTER — Ambulatory Visit (INDEPENDENT_AMBULATORY_CARE_PROVIDER_SITE_OTHER): Admitting: Physical Therapy

## 2024-02-13 ENCOUNTER — Other Ambulatory Visit: Payer: Self-pay | Admitting: Surgical

## 2024-02-13 DIAGNOSIS — R600 Localized edema: Secondary | ICD-10-CM

## 2024-02-13 DIAGNOSIS — M6281 Muscle weakness (generalized): Secondary | ICD-10-CM

## 2024-02-13 DIAGNOSIS — M25611 Stiffness of right shoulder, not elsewhere classified: Secondary | ICD-10-CM | POA: Diagnosis not present

## 2024-02-13 DIAGNOSIS — M79621 Pain in right upper arm: Secondary | ICD-10-CM

## 2024-02-13 DIAGNOSIS — M25621 Stiffness of right elbow, not elsewhere classified: Secondary | ICD-10-CM | POA: Diagnosis not present

## 2024-02-13 DIAGNOSIS — R279 Unspecified lack of coordination: Secondary | ICD-10-CM

## 2024-02-13 MED ORDER — OXYCODONE-ACETAMINOPHEN 5-325 MG PO TABS: 1.0000 | ORAL_TABLET | Freq: Two times a day (BID) | ORAL | 0 refills | Status: DC | PRN

## 2024-02-13 NOTE — Telephone Encounter (Signed)
 Sent in
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
## 2024-02-20 ENCOUNTER — Ambulatory Visit (INDEPENDENT_AMBULATORY_CARE_PROVIDER_SITE_OTHER): Admitting: Rehabilitative and Restorative Service Providers"

## 2024-02-20 ENCOUNTER — Encounter: Payer: Self-pay | Admitting: Rehabilitative and Restorative Service Providers"

## 2024-02-20 DIAGNOSIS — M25611 Stiffness of right shoulder, not elsewhere classified: Secondary | ICD-10-CM | POA: Diagnosis not present

## 2024-02-20 DIAGNOSIS — M25621 Stiffness of right elbow, not elsewhere classified: Secondary | ICD-10-CM

## 2024-02-20 DIAGNOSIS — M79621 Pain in right upper arm: Secondary | ICD-10-CM

## 2024-02-20 DIAGNOSIS — M6281 Muscle weakness (generalized): Secondary | ICD-10-CM

## 2024-02-20 DIAGNOSIS — R600 Localized edema: Secondary | ICD-10-CM

## 2024-02-20 DIAGNOSIS — R279 Unspecified lack of coordination: Secondary | ICD-10-CM
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
## 2024-02-26 ENCOUNTER — Encounter: Payer: Self-pay | Admitting: Orthopedic Surgery

## 2024-02-26 ENCOUNTER — Ambulatory Visit (INDEPENDENT_AMBULATORY_CARE_PROVIDER_SITE_OTHER): Admitting: Orthopedic Surgery

## 2024-02-26 ENCOUNTER — Other Ambulatory Visit (INDEPENDENT_AMBULATORY_CARE_PROVIDER_SITE_OTHER)

## 2024-02-26 DIAGNOSIS — S42291D Other displaced fracture of upper end of right humerus, subsequent encounter for fracture with routine healing: Secondary | ICD-10-CM | POA: Diagnosis not present
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
## 2024-02-27 ENCOUNTER — Encounter (HOSPITAL_COMMUNITY)
Admission: RE | Admit: 2024-02-27 | Discharge: 2024-02-27 | Disposition: A | Discharge status: Home / Self Care | Source: Ambulatory Visit | Attending: Neurology | Admitting: Neurology

## 2024-02-27 DIAGNOSIS — R251 Tremor, unspecified: Secondary | ICD-10-CM | POA: Diagnosis present

## 2024-02-27 MED ORDER — POTASSIUM IODIDE (ANTIDOTE) 130 MG PO TABS
ORAL_TABLET | ORAL | Status: AC
  Filled 2024-02-27: qty 1

## 2024-02-27 MED ORDER — IOFLUPANE I 123 185 MBQ/2.5ML IV SOLN
4.3000 | Freq: Once | INTRAVENOUS | Status: AC | PRN
  Administered 2024-02-27: 4.3 via INTRAVENOUS
  Filled 2024-02-27: qty 5

## 2024-03-02 ENCOUNTER — Other Ambulatory Visit: Payer: Self-pay | Admitting: Surgical

## 2024-03-02 ENCOUNTER — Telehealth: Payer: Self-pay | Admitting: Orthopedic Surgery

## 2024-03-02 MED ORDER — OXYCODONE-ACETAMINOPHEN 5-325 MG PO TABS: 1.0000 | ORAL_TABLET | Freq: Two times a day (BID) | ORAL | 0 refills | Status: AC | PRN

## 2024-03-02 NOTE — Telephone Encounter (Signed)
 Pt's called stating he never received the replacement pain medication from Dr Rozelle Corning that was suppose  to be sent to pharmacy. Pease call pt about this matter at [phone]

## 2024-03-02 NOTE — Telephone Encounter (Signed)
 I sent in this refill just now

## 2024-03-03 ENCOUNTER — Encounter: Payer: Self-pay | Admitting: Rehabilitative and Restorative Service Providers"

## 2024-03-03 ENCOUNTER — Ambulatory Visit (INDEPENDENT_AMBULATORY_CARE_PROVIDER_SITE_OTHER): Admitting: Rehabilitative and Restorative Service Providers"

## 2024-03-03 DIAGNOSIS — M25611 Stiffness of right shoulder, not elsewhere classified: Secondary | ICD-10-CM | POA: Diagnosis not present

## 2024-03-03 DIAGNOSIS — M79621 Pain in right upper arm: Secondary | ICD-10-CM | POA: Diagnosis not present

## 2024-03-03 DIAGNOSIS — M6281 Muscle weakness (generalized): Secondary | ICD-10-CM

## 2024-03-03 DIAGNOSIS — M25621 Stiffness of right elbow, not elsewhere classified: Secondary | ICD-10-CM
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
## 2024-03-05 ENCOUNTER — Encounter: Payer: Self-pay | Admitting: Rehabilitative and Restorative Service Providers"

## 2024-03-05 ENCOUNTER — Ambulatory Visit (INDEPENDENT_AMBULATORY_CARE_PROVIDER_SITE_OTHER): Admitting: Rehabilitative and Restorative Service Providers"

## 2024-03-05 DIAGNOSIS — R279 Unspecified lack of coordination: Secondary | ICD-10-CM

## 2024-03-05 DIAGNOSIS — M79621 Pain in right upper arm: Secondary | ICD-10-CM

## 2024-03-05 DIAGNOSIS — M25611 Stiffness of right shoulder, not elsewhere classified: Secondary | ICD-10-CM | POA: Diagnosis not present

## 2024-03-05 DIAGNOSIS — R600 Localized edema: Secondary | ICD-10-CM

## 2024-03-05 DIAGNOSIS — M6281 Muscle weakness (generalized): Secondary | ICD-10-CM | POA: Diagnosis not present

## 2024-03-05 DIAGNOSIS — M25621 Stiffness of right elbow, not elsewhere classified: Secondary | ICD-10-CM | POA: Diagnosis not present
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
## 2024-03-10 ENCOUNTER — Encounter: Payer: Self-pay | Admitting: Rehabilitative and Restorative Service Providers"

## 2024-03-10 ENCOUNTER — Ambulatory Visit (INDEPENDENT_AMBULATORY_CARE_PROVIDER_SITE_OTHER): Admitting: Rehabilitative and Restorative Service Providers"

## 2024-03-10 DIAGNOSIS — M6281 Muscle weakness (generalized): Secondary | ICD-10-CM | POA: Diagnosis not present

## 2024-03-10 DIAGNOSIS — M79621 Pain in right upper arm: Secondary | ICD-10-CM

## 2024-03-10 DIAGNOSIS — M25611 Stiffness of right shoulder, not elsewhere classified: Secondary | ICD-10-CM

## 2024-03-10 DIAGNOSIS — R600 Localized edema: Secondary | ICD-10-CM

## 2024-03-10 DIAGNOSIS — M25621 Stiffness of right elbow, not elsewhere classified: Secondary | ICD-10-CM | POA: Diagnosis not present

## 2024-03-10 DIAGNOSIS — R279 Unspecified lack of coordination: Secondary | ICD-10-CM
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
## 2024-03-12 ENCOUNTER — Ambulatory Visit (INDEPENDENT_AMBULATORY_CARE_PROVIDER_SITE_OTHER): Admitting: Rehabilitative and Restorative Service Providers"

## 2024-03-12 ENCOUNTER — Encounter: Payer: Self-pay | Admitting: Rehabilitative and Restorative Service Providers"

## 2024-03-12 DIAGNOSIS — M25621 Stiffness of right elbow, not elsewhere classified: Secondary | ICD-10-CM

## 2024-03-12 DIAGNOSIS — M25611 Stiffness of right shoulder, not elsewhere classified: Secondary | ICD-10-CM

## 2024-03-12 DIAGNOSIS — M79621 Pain in right upper arm: Secondary | ICD-10-CM | POA: Diagnosis not present

## 2024-03-12 DIAGNOSIS — R600 Localized edema: Secondary | ICD-10-CM

## 2024-03-12 DIAGNOSIS — R279 Unspecified lack of coordination: Secondary | ICD-10-CM

## 2024-03-12 DIAGNOSIS — M6281 Muscle weakness (generalized): Secondary | ICD-10-CM | POA: Diagnosis not present
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
## 2024-03-17 ENCOUNTER — Ambulatory Visit (INDEPENDENT_AMBULATORY_CARE_PROVIDER_SITE_OTHER): Admitting: Rehabilitative and Restorative Service Providers"

## 2024-03-17 ENCOUNTER — Encounter: Payer: Self-pay | Admitting: Rehabilitative and Restorative Service Providers"

## 2024-03-17 DIAGNOSIS — M79621 Pain in right upper arm: Secondary | ICD-10-CM

## 2024-03-17 DIAGNOSIS — R600 Localized edema: Secondary | ICD-10-CM

## 2024-03-17 DIAGNOSIS — M6281 Muscle weakness (generalized): Secondary | ICD-10-CM | POA: Diagnosis not present

## 2024-03-17 DIAGNOSIS — M25611 Stiffness of right shoulder, not elsewhere classified: Secondary | ICD-10-CM

## 2024-03-17 DIAGNOSIS — M25621 Stiffness of right elbow, not elsewhere classified: Secondary | ICD-10-CM

## 2024-03-17 DIAGNOSIS — R279 Unspecified lack of coordination: Secondary | ICD-10-CM
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
## 2024-03-19 ENCOUNTER — Ambulatory Visit: Admitting: Rehabilitative and Restorative Service Providers"

## 2024-03-19 ENCOUNTER — Encounter: Payer: Self-pay | Admitting: Rehabilitative and Restorative Service Providers"

## 2024-03-19 DIAGNOSIS — M79621 Pain in right upper arm: Secondary | ICD-10-CM | POA: Diagnosis not present

## 2024-03-19 DIAGNOSIS — M25611 Stiffness of right shoulder, not elsewhere classified: Secondary | ICD-10-CM | POA: Diagnosis not present

## 2024-03-19 DIAGNOSIS — R279 Unspecified lack of coordination: Secondary | ICD-10-CM

## 2024-03-19 DIAGNOSIS — M25621 Stiffness of right elbow, not elsewhere classified: Secondary | ICD-10-CM

## 2024-03-19 DIAGNOSIS — R600 Localized edema: Secondary | ICD-10-CM

## 2024-03-19 DIAGNOSIS — M6281 Muscle weakness (generalized): Secondary | ICD-10-CM
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
## 2024-04-01 ENCOUNTER — Encounter: Payer: Self-pay | Admitting: Rehabilitative and Restorative Service Providers"

## 2024-04-01 ENCOUNTER — Ambulatory Visit (INDEPENDENT_AMBULATORY_CARE_PROVIDER_SITE_OTHER): Admitting: Rehabilitative and Restorative Service Providers"

## 2024-04-01 DIAGNOSIS — M6281 Muscle weakness (generalized): Secondary | ICD-10-CM | POA: Diagnosis not present

## 2024-04-01 DIAGNOSIS — M25621 Stiffness of right elbow, not elsewhere classified: Secondary | ICD-10-CM | POA: Diagnosis not present

## 2024-04-01 DIAGNOSIS — R279 Unspecified lack of coordination: Secondary | ICD-10-CM

## 2024-04-01 DIAGNOSIS — M79621 Pain in right upper arm: Secondary | ICD-10-CM | POA: Diagnosis not present

## 2024-04-01 DIAGNOSIS — M25611 Stiffness of right shoulder, not elsewhere classified: Secondary | ICD-10-CM | POA: Diagnosis not present

## 2024-04-01 DIAGNOSIS — R600 Localized edema: Secondary | ICD-10-CM
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
## 2024-04-03 ENCOUNTER — Encounter: Payer: Self-pay | Admitting: Rehabilitative and Restorative Service Providers"

## 2024-04-03 ENCOUNTER — Ambulatory Visit (INDEPENDENT_AMBULATORY_CARE_PROVIDER_SITE_OTHER): Admitting: Rehabilitative and Restorative Service Providers"

## 2024-04-03 DIAGNOSIS — M25611 Stiffness of right shoulder, not elsewhere classified: Secondary | ICD-10-CM | POA: Diagnosis not present

## 2024-04-03 DIAGNOSIS — M79621 Pain in right upper arm: Secondary | ICD-10-CM | POA: Diagnosis not present

## 2024-04-03 DIAGNOSIS — M25621 Stiffness of right elbow, not elsewhere classified: Secondary | ICD-10-CM

## 2024-04-03 DIAGNOSIS — R279 Unspecified lack of coordination: Secondary | ICD-10-CM

## 2024-04-03 DIAGNOSIS — M6281 Muscle weakness (generalized): Secondary | ICD-10-CM

## 2024-04-03 DIAGNOSIS — R600 Localized edema: Secondary | ICD-10-CM
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
## 2024-04-07 ENCOUNTER — Encounter: Payer: Self-pay | Admitting: Rehabilitative and Restorative Service Providers"

## 2024-04-07 ENCOUNTER — Ambulatory Visit (INDEPENDENT_AMBULATORY_CARE_PROVIDER_SITE_OTHER): Admitting: Rehabilitative and Restorative Service Providers"

## 2024-04-07 DIAGNOSIS — M79621 Pain in right upper arm: Secondary | ICD-10-CM

## 2024-04-07 DIAGNOSIS — M6281 Muscle weakness (generalized): Secondary | ICD-10-CM | POA: Diagnosis not present

## 2024-04-07 DIAGNOSIS — R600 Localized edema: Secondary | ICD-10-CM

## 2024-04-07 DIAGNOSIS — M25611 Stiffness of right shoulder, not elsewhere classified: Secondary | ICD-10-CM

## 2024-04-07 DIAGNOSIS — R279 Unspecified lack of coordination: Secondary | ICD-10-CM

## 2024-04-07 DIAGNOSIS — M25621 Stiffness of right elbow, not elsewhere classified: Secondary | ICD-10-CM | POA: Diagnosis not present
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
## 2024-04-09 ENCOUNTER — Ambulatory Visit (INDEPENDENT_AMBULATORY_CARE_PROVIDER_SITE_OTHER): Admitting: Rehabilitative and Restorative Service Providers"

## 2024-04-09 ENCOUNTER — Encounter: Payer: Self-pay | Admitting: Rehabilitative and Restorative Service Providers"

## 2024-04-09 DIAGNOSIS — R279 Unspecified lack of coordination: Secondary | ICD-10-CM

## 2024-04-09 DIAGNOSIS — M79621 Pain in right upper arm: Secondary | ICD-10-CM

## 2024-04-09 DIAGNOSIS — M25621 Stiffness of right elbow, not elsewhere classified: Secondary | ICD-10-CM

## 2024-04-09 DIAGNOSIS — R600 Localized edema: Secondary | ICD-10-CM

## 2024-04-09 DIAGNOSIS — M25611 Stiffness of right shoulder, not elsewhere classified: Secondary | ICD-10-CM

## 2024-04-09 DIAGNOSIS — M6281 Muscle weakness (generalized): Secondary | ICD-10-CM | POA: Diagnosis not present
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
## 2024-04-14 ENCOUNTER — Encounter: Payer: Self-pay | Admitting: Rehabilitative and Restorative Service Providers"

## 2024-04-14 ENCOUNTER — Ambulatory Visit (INDEPENDENT_AMBULATORY_CARE_PROVIDER_SITE_OTHER): Admitting: Rehabilitative and Restorative Service Providers"

## 2024-04-14 DIAGNOSIS — M6281 Muscle weakness (generalized): Secondary | ICD-10-CM | POA: Diagnosis not present

## 2024-04-14 DIAGNOSIS — M25621 Stiffness of right elbow, not elsewhere classified: Secondary | ICD-10-CM

## 2024-04-14 DIAGNOSIS — R279 Unspecified lack of coordination: Secondary | ICD-10-CM

## 2024-04-14 DIAGNOSIS — M25611 Stiffness of right shoulder, not elsewhere classified: Secondary | ICD-10-CM | POA: Diagnosis not present

## 2024-04-14 DIAGNOSIS — R600 Localized edema: Secondary | ICD-10-CM

## 2024-04-14 DIAGNOSIS — M79621 Pain in right upper arm: Secondary | ICD-10-CM | POA: Diagnosis not present
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
## 2024-04-16 ENCOUNTER — Ambulatory Visit (INDEPENDENT_AMBULATORY_CARE_PROVIDER_SITE_OTHER): Admitting: Rehabilitative and Restorative Service Providers"

## 2024-04-16 ENCOUNTER — Encounter: Payer: Self-pay | Admitting: Rehabilitative and Restorative Service Providers"

## 2024-04-16 DIAGNOSIS — M6281 Muscle weakness (generalized): Secondary | ICD-10-CM | POA: Diagnosis not present

## 2024-04-16 DIAGNOSIS — M79621 Pain in right upper arm: Secondary | ICD-10-CM

## 2024-04-16 DIAGNOSIS — M25611 Stiffness of right shoulder, not elsewhere classified: Secondary | ICD-10-CM | POA: Diagnosis not present

## 2024-04-16 DIAGNOSIS — R279 Unspecified lack of coordination: Secondary | ICD-10-CM

## 2024-04-16 DIAGNOSIS — M25621 Stiffness of right elbow, not elsewhere classified: Secondary | ICD-10-CM | POA: Diagnosis not present

## 2024-04-16 DIAGNOSIS — R600 Localized edema: Secondary | ICD-10-CM
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
## 2024-04-21 ENCOUNTER — Encounter: Admitting: Rehabilitative and Restorative Service Providers"

## 2024-04-23 ENCOUNTER — Ambulatory Visit (INDEPENDENT_AMBULATORY_CARE_PROVIDER_SITE_OTHER): Admitting: Rehabilitative and Restorative Service Providers"

## 2024-04-23 ENCOUNTER — Encounter: Payer: Self-pay | Admitting: Rehabilitative and Restorative Service Providers"

## 2024-04-23 DIAGNOSIS — M79621 Pain in right upper arm: Secondary | ICD-10-CM

## 2024-04-23 DIAGNOSIS — R600 Localized edema: Secondary | ICD-10-CM

## 2024-04-23 DIAGNOSIS — M6281 Muscle weakness (generalized): Secondary | ICD-10-CM

## 2024-04-23 DIAGNOSIS — R279 Unspecified lack of coordination: Secondary | ICD-10-CM

## 2024-04-23 DIAGNOSIS — M25611 Stiffness of right shoulder, not elsewhere classified: Secondary | ICD-10-CM

## 2024-04-23 DIAGNOSIS — M25621 Stiffness of right elbow, not elsewhere classified: Secondary | ICD-10-CM
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
## 2024-04-29 ENCOUNTER — Encounter: Payer: Self-pay | Admitting: Orthopedic Surgery

## 2024-04-29 ENCOUNTER — Ambulatory Visit: Admitting: Surgical

## 2024-04-29 ENCOUNTER — Other Ambulatory Visit (INDEPENDENT_AMBULATORY_CARE_PROVIDER_SITE_OTHER): Payer: Self-pay

## 2024-04-29 DIAGNOSIS — S42291D Other displaced fracture of upper end of right humerus, subsequent encounter for fracture with routine healing: Secondary | ICD-10-CM

## 2024-04-29 NOTE — Addendum Note (Signed)
 Addended by: Analys Ryden on: 04/29/2024 12:44 PM   Modules accepted: Orders
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
## 2024-06-02 ENCOUNTER — Telehealth: Payer: Self-pay | Admitting: Surgical

## 2024-06-02 NOTE — Telephone Encounter (Signed)
 Patient called. He would like a letter stating he is done and not under the care of the doctor any longer. He would like the note mailed to him.

## 2024-06-04 NOTE — Telephone Encounter (Signed)
 Note generated an mailed

## 2024-06-04 NOTE — Telephone Encounter (Signed)
 Yeds thx

## 2024-07-20 ENCOUNTER — Encounter: Payer: Self-pay | Admitting: Radiology
